# Patient Record
Sex: Male | Born: 1950 | Race: White | Hispanic: No | Marital: Married | Smoking: Never smoker
Health system: Southern US, Community
[De-identification: ages and names within clinical notes are randomized; demographics above are authoritative.]

## PROBLEM LIST (undated history)

## (undated) DIAGNOSIS — R946 Abnormal results of thyroid function studies: Secondary | ICD-10-CM

## (undated) DIAGNOSIS — J309 Allergic rhinitis, unspecified: Secondary | ICD-10-CM

## (undated) DIAGNOSIS — E785 Hyperlipidemia, unspecified: Secondary | ICD-10-CM

## (undated) DIAGNOSIS — F32A Depression, unspecified: Secondary | ICD-10-CM

## (undated) DIAGNOSIS — K219 Gastro-esophageal reflux disease without esophagitis: Secondary | ICD-10-CM

## (undated) DIAGNOSIS — C801 Malignant (primary) neoplasm, unspecified: Secondary | ICD-10-CM

## (undated) DIAGNOSIS — M1990 Unspecified osteoarthritis, unspecified site: Secondary | ICD-10-CM

## (undated) DIAGNOSIS — F329 Major depressive disorder, single episode, unspecified: Secondary | ICD-10-CM

## (undated) DIAGNOSIS — I10 Essential (primary) hypertension: Secondary | ICD-10-CM

## (undated) HISTORY — PX: HERNIA REPAIR: SHX51

---

## 2005-11-13 ENCOUNTER — Ambulatory Visit: Payer: Self-pay | Admitting: Unknown Physician Specialty

## 2007-01-28 ENCOUNTER — Other Ambulatory Visit: Payer: Self-pay

## 2007-01-28 ENCOUNTER — Emergency Department: Payer: Self-pay | Admitting: Internal Medicine

## 2009-01-17 ENCOUNTER — Ambulatory Visit: Payer: Self-pay | Admitting: Unknown Physician Specialty

## 2014-02-14 DIAGNOSIS — F329 Major depressive disorder, single episode, unspecified: Secondary | ICD-10-CM | POA: Insufficient documentation

## 2014-02-14 DIAGNOSIS — E291 Testicular hypofunction: Secondary | ICD-10-CM | POA: Insufficient documentation

## 2014-02-14 DIAGNOSIS — I10 Essential (primary) hypertension: Secondary | ICD-10-CM | POA: Insufficient documentation

## 2014-02-14 DIAGNOSIS — K219 Gastro-esophageal reflux disease without esophagitis: Secondary | ICD-10-CM | POA: Insufficient documentation

## 2014-02-14 DIAGNOSIS — F3289 Other specified depressive episodes: Secondary | ICD-10-CM | POA: Insufficient documentation

## 2014-02-14 DIAGNOSIS — E785 Hyperlipidemia, unspecified: Secondary | ICD-10-CM | POA: Insufficient documentation

## 2014-02-14 DIAGNOSIS — J309 Allergic rhinitis, unspecified: Secondary | ICD-10-CM | POA: Insufficient documentation

## 2016-12-02 ENCOUNTER — Encounter: Payer: Self-pay | Admitting: *Deleted

## 2016-12-03 ENCOUNTER — Encounter
Admission: RE | Disposition: A | Discharge status: Home / Self Care | Payer: Self-pay | Source: Ambulatory Visit | Attending: Unknown Physician Specialty

## 2016-12-03 ENCOUNTER — Ambulatory Visit: Payer: Medicare Other | Admitting: Anesthesiology

## 2016-12-03 ENCOUNTER — Ambulatory Visit
Admission: RE | Admit: 2016-12-03 | Discharge: 2016-12-03 | Disposition: A | Discharge status: Home / Self Care | Payer: Medicare Other | Source: Ambulatory Visit | Attending: Unknown Physician Specialty | Admitting: Unknown Physician Specialty

## 2016-12-03 ENCOUNTER — Encounter: Payer: Self-pay | Admitting: *Deleted

## 2016-12-03 DIAGNOSIS — J309 Allergic rhinitis, unspecified: Secondary | ICD-10-CM | POA: Diagnosis not present

## 2016-12-03 DIAGNOSIS — E785 Hyperlipidemia, unspecified: Secondary | ICD-10-CM | POA: Insufficient documentation

## 2016-12-03 DIAGNOSIS — Z1211 Encounter for screening for malignant neoplasm of colon: Secondary | ICD-10-CM | POA: Insufficient documentation

## 2016-12-03 DIAGNOSIS — Z86010 Personal history of colonic polyps: Secondary | ICD-10-CM | POA: Diagnosis not present

## 2016-12-03 DIAGNOSIS — I10 Essential (primary) hypertension: Secondary | ICD-10-CM | POA: Insufficient documentation

## 2016-12-03 DIAGNOSIS — E039 Hypothyroidism, unspecified: Secondary | ICD-10-CM | POA: Diagnosis not present

## 2016-12-03 DIAGNOSIS — K640 First degree hemorrhoids: Secondary | ICD-10-CM | POA: Diagnosis not present

## 2016-12-03 DIAGNOSIS — F329 Major depressive disorder, single episode, unspecified: Secondary | ICD-10-CM | POA: Diagnosis not present

## 2016-12-03 DIAGNOSIS — Z7982 Long term (current) use of aspirin: Secondary | ICD-10-CM | POA: Insufficient documentation

## 2016-12-03 DIAGNOSIS — K219 Gastro-esophageal reflux disease without esophagitis: Secondary | ICD-10-CM | POA: Diagnosis not present

## 2016-12-03 DIAGNOSIS — Z79899 Other long term (current) drug therapy: Secondary | ICD-10-CM | POA: Diagnosis not present

## 2016-12-03 DIAGNOSIS — Z9889 Other specified postprocedural states: Secondary | ICD-10-CM | POA: Diagnosis not present

## 2016-12-03 HISTORY — DX: Allergic rhinitis, unspecified: J30.9

## 2016-12-03 HISTORY — PX: COLONOSCOPY WITH PROPOFOL: SHX5780

## 2016-12-03 HISTORY — DX: Major depressive disorder, single episode, unspecified: F32.9

## 2016-12-03 HISTORY — DX: Essential (primary) hypertension: I10

## 2016-12-03 HISTORY — DX: Abnormal results of thyroid function studies: R94.6

## 2016-12-03 HISTORY — DX: Hyperlipidemia, unspecified: E78.5

## 2016-12-03 HISTORY — DX: Gastro-esophageal reflux disease without esophagitis: K21.9

## 2016-12-03 HISTORY — DX: Depression, unspecified: F32.A

## 2016-12-03 SURGERY — COLONOSCOPY WITH PROPOFOL
Anesthesia: General

## 2016-12-03 MED ORDER — FENTANYL CITRATE (PF) 100 MCG/2ML IJ SOLN
INTRAMUSCULAR | Status: DC | PRN
  Administered 2016-12-03 (×2): 25 ug via INTRAVENOUS
  Administered 2016-12-03: 50 ug via INTRAVENOUS

## 2016-12-03 MED ORDER — LIDOCAINE HCL (PF) 2 % IJ SOLN
INTRAMUSCULAR | Status: DC | PRN
  Administered 2016-12-03: 50 mg

## 2016-12-03 MED ORDER — PROPOFOL 10 MG/ML IV BOLUS
INTRAVENOUS | Status: DC | PRN
  Administered 2016-12-03: 30 mg via INTRAVENOUS
  Administered 2016-12-03: 20 mg via INTRAVENOUS

## 2016-12-03 MED ORDER — PROPOFOL 500 MG/50ML IV EMUL
INTRAVENOUS | Status: AC
  Filled 2016-12-03: qty 50

## 2016-12-03 MED ORDER — SODIUM CHLORIDE 0.9 % IV SOLN
INTRAVENOUS | Status: DC
  Administered 2016-12-03: 08:00:00 via INTRAVENOUS

## 2016-12-03 MED ORDER — FENTANYL CITRATE (PF) 100 MCG/2ML IJ SOLN
INTRAMUSCULAR | Status: AC
  Filled 2016-12-03: qty 2

## 2016-12-03 MED ORDER — PROPOFOL 500 MG/50ML IV EMUL
INTRAVENOUS | Status: DC | PRN
  Administered 2016-12-03: 75 ug/kg/min via INTRAVENOUS

## 2016-12-03 MED ORDER — SODIUM CHLORIDE 0.9 % IV SOLN: INTRAVENOUS | Status: DC

## 2016-12-03 NOTE — Transfer of Care (Signed)
Immediate Anesthesia Transfer of Care Note  Patient: Marc Caldwell  Procedure(s) Performed: Procedure(s): COLONOSCOPY WITH PROPOFOL (N/A)  Patient Location: PACU  Anesthesia Type:General  Level of Consciousness: sedated  Airway & Oxygen Therapy: Patient Spontanous Breathing and Patient connected to nasal cannula oxygen  Post-op Assessment: Report given to RN and Post -op Vital signs reviewed and stable  Post vital signs: Reviewed and stable  Last Vitals:  Vitals:   12/03/16 0743  BP: (!) 150/72  Pulse: 68  Resp: 18  Temp: 36.4 C    Last Pain:  Vitals:   12/03/16 0743  TempSrc: Tympanic         Complications: No apparent anesthesia complications

## 2016-12-03 NOTE — Op Note (Signed)
[facility] Patient Name: Marc Caldwell Procedure Date: 12/03/2016 8:22 AM MRN: [id number] Account #: [id number] Date of Birth: Apr 09, 1951 Admit Type: Outpatient Age: 66 Room: [facility] ENDO ROOM 1 Gender: Male Note Status: Finalized Procedure:            Colonoscopy Indications:          High risk colon cancer surveillance: Personal history                        of colonic polyps Providers:            Manya Silvas, MD Referring MD:         Caprice Renshaw MD (Referring MD) Medicines:            Propofol per Anesthesia Complications:        No immediate complications. Procedure:            Pre-Anesthesia Assessment:                       - After reviewing the risks and benefits, the patient                        was deemed in satisfactory condition to undergo the                        procedure.                       After obtaining informed consent, the colonoscope was                        passed under direct vision. Throughout the procedure,                        the patient's blood pressure, pulse, and oxygen                        saturations were monitored continuously. The                        Colonoscope was introduced through the anus and                        advanced to the the cecum, identified by appendiceal                        orifice and ileocecal valve. The colonoscopy was                        performed without difficulty. The patient tolerated the                        procedure well. The quality of the bowel preparation                        was good. Findings:      Internal hemorrhoids were found during endoscopy. The hemorrhoids were       small and Grade I (internal hemorrhoids that do not prolapse).      The exam was otherwise without abnormality.      THERE IS a SMALL soft nodule on  left side of prostate and you may need       to see a UROLOGIST. Impression:           - Internal hemorrhoids.            - The examination was otherwise normal.                       - No specimens collected. Recommendation:       - The findings and recommendations were discussed with                        the patient's family. Manya Silvas, MD 12/03/2016 8:49:19 AM This report has been signed electronically. Number of Addenda: 0 Note Initiated On: 12/03/2016 8:22 AM Scope Withdrawal Time: 0 hours 11 minutes 39 seconds  Total Procedure Duration: 0 hours 19 minutes 5 seconds       [facility]

## 2016-12-03 NOTE — Anesthesia Postprocedure Evaluation (Signed)
Anesthesia Post Note  Patient: Marc Caldwell  Procedure(s) Performed: Procedure(s) (LRB): COLONOSCOPY WITH PROPOFOL (N/A)  Patient location during evaluation: Endoscopy Anesthesia Type: General Level of consciousness: awake and alert and oriented Pain management: pain level controlled Vital Signs Assessment: post-procedure vital signs reviewed and stable Respiratory status: spontaneous breathing, nonlabored ventilation and respiratory function stable Cardiovascular status: blood pressure returned to baseline and stable Postop Assessment: no signs of nausea or vomiting Anesthetic complications: no     Last Vitals:  Vitals:   12/03/16 0900 12/03/16 0910  BP: 130/66 138/65  Pulse: 66 60  Resp: 17 13  Temp:      Last Pain:  Vitals:   12/03/16 0840  TempSrc: Tympanic                 [name]

## 2016-12-03 NOTE — H&P (Signed)
   Primary Care Physician:  Marcello Fennel, MD Primary Gastroenterologist:  Dr. Vira Agar  Pre-Procedure History & Physical: HPI:  Marc Caldwell is a 66 y.o. male is here for an colonoscopy.   Past Medical History:  Diagnosis Date  . Allergic rhinitis   . Borderline hypothyroidism   . Depressive disorder   . GERD (gastroesophageal reflux disease)   . Hyperlipidemia   . Hypertension     Past Surgical History:  Procedure Laterality Date  . HERNIA REPAIR      Prior to Admission medications   Medication Sig Start Date End Date Taking? Authorizing Provider  amLODipine (NORVASC) 5 MG tablet Take 5 mg by mouth daily.   Yes Historical Provider, MD  aspirin EC 81 MG tablet Take 81 mg by mouth daily.   Yes Historical Provider, MD  cetirizine (ZYRTEC) 10 MG tablet Take 10 mg by mouth daily.   Yes Historical Provider, MD  esomeprazole (NEXIUM) 40 MG packet Take 40 mg by mouth daily before breakfast.   Yes Historical Provider, MD  losartan-hydrochlorothiazide (HYZAAR) 100-25 MG tablet Take 1 tablet by mouth daily.   Yes Historical Provider, MD  Multiple Vitamin (MULTIVITAMIN) tablet Take 1 tablet by mouth daily.   Yes Historical Provider, MD  naftifine (NAFTIN) 1 % cream Apply topically daily.   Yes Historical Provider, MD  niacin 500 MG CR capsule Take 500 mg by mouth at bedtime.   Yes Historical Provider, MD  PARoxetine (PAXIL) 20 MG tablet Take 20 mg by mouth daily.   Yes Historical Provider, MD  simvastatin (ZOCOR) 20 MG tablet Take 20 mg by mouth daily.   Yes Historical Provider, MD  tamsulosin (FLOMAX) 0.4 MG CAPS capsule Take 0.4 mg by mouth.   Yes Historical Provider, MD    Allergies as of 11/03/2016  . (Not on File)    History reviewed. No pertinent family history.  Social History   Social History  . Marital status: Married    Spouse name: N/A  . Number of children: N/A  . Years of education: N/A   Occupational History  . Not on file.   Social History Main Topics   . Smoking status: Never Smoker  . Smokeless tobacco: Never Used  . Alcohol use Yes  . Drug use: No  . Sexual activity: Not on file   Other Topics Concern  . Not on file   Social History Narrative  . No narrative on file    Review of Systems: See HPI, otherwise negative ROS  Physical Exam: BP (!) 150/72   Pulse 68   Temp 97.5 F (36.4 C) (Tympanic)   Resp 18   Ht 5\' 11"  (1.803 m)   Wt 108 kg (238 lb)   SpO2 98%   BMI 33.19 kg/m  General:   Alert,  pleasant and cooperative in NAD Head:  Normocephalic and atraumatic. Neck:  Supple; no masses or thyromegaly. Lungs:  Clear throughout to auscultation.    Heart:  Regular rate and rhythm. Abdomen:  Soft, nontender and nondistended. Normal bowel sounds, without guarding, and without rebound.   Neurologic:  Alert and  oriented x4;  grossly normal neurologically.  Impression/Plan: Marc Caldwell is here for an colonoscopy to be performed for [facility] colon polyps  Risks, benefits, limitations, and alternatives regarding  colonoscopy have been reviewed with the patient.  Questions have been answered.  All parties agreeable.   Gaylyn Cheers, MD  12/03/2016, 8:22 AM

## 2016-12-03 NOTE — Anesthesia Post-op Follow-up Note (Cosign Needed)
Anesthesia QCDR form completed.        

## 2016-12-03 NOTE — Anesthesia Preprocedure Evaluation (Signed)
Anesthesia Evaluation  Patient identified by MRN, date of birth, ID band Patient awake    Reviewed: Allergy & Precautions, NPO status , Patient's Chart, lab work & pertinent test results  History of Anesthesia Complications Negative for: history of anesthetic complications  Airway Mallampati: III  TM Distance: >3 FB Neck ROM: Full    Dental no notable dental hx.    Pulmonary neg pulmonary ROS, Sleep apnea: suggestive hx, not diagnosed. , neg COPD,    breath sounds clear to auscultation- rhonchi (-) wheezing      Cardiovascular Exercise Tolerance: Good hypertension, Pt. on medications (-) CAD and (-) Past MI  Rhythm:Regular Rate:Normal - Systolic murmurs and - Diastolic murmurs    Neuro/Psych PSYCHIATRIC DISORDERS Depression negative neurological ROS     GI/Hepatic Neg liver ROS, GERD  ,  Endo/Other  neg diabetes  Renal/GU negative Renal ROS     Musculoskeletal negative musculoskeletal ROS (+)   Abdominal (+) + obese,   Peds  Hematology negative hematology ROS (+)   Anesthesia Other Findings Past Medical History: No date: Allergic rhinitis No date: Borderline hypothyroidism No date: Depressive disorder No date: GERD (gastroesophageal reflux disease) No date: Hyperlipidemia No date: Hypertension   Reproductive/Obstetrics                             Anesthesia Physical Anesthesia Plan  ASA: II  Anesthesia Plan: General   Post-op Pain Management:    Induction: Intravenous  Airway Management Planned: Natural Airway  Additional Equipment:   Intra-op Plan:   Post-operative Plan:   Informed Consent: I have reviewed the patients History and Physical, chart, labs and discussed the procedure including the risks, benefits and alternatives for the proposed anesthesia with the patient or authorized representative who has indicated his/her understanding and acceptance.   Dental  advisory given  Plan Discussed with: CRNA and Anesthesiologist  Anesthesia Plan Comments:         Anesthesia Quick Evaluation

## 2016-12-06 ENCOUNTER — Encounter: Payer: Self-pay | Admitting: Unknown Physician Specialty

## 2018-02-15 ENCOUNTER — Ambulatory Visit (INDEPENDENT_AMBULATORY_CARE_PROVIDER_SITE_OTHER): Payer: Medicare Other | Admitting: Urology

## 2018-02-15 ENCOUNTER — Encounter: Payer: Self-pay | Admitting: Urology

## 2018-02-15 VITALS — BP 168/84 | HR 79 | Resp 16 | Ht 71.0 in | Wt 238.6 lb

## 2018-02-15 DIAGNOSIS — N402 Nodular prostate without lower urinary tract symptoms: Secondary | ICD-10-CM | POA: Diagnosis not present

## 2018-02-15 DIAGNOSIS — N400 Enlarged prostate without lower urinary tract symptoms: Secondary | ICD-10-CM | POA: Insufficient documentation

## 2018-02-15 LAB — URINALYSIS, COMPLETE
BILIRUBIN UA: NEGATIVE
GLUCOSE, UA: NEGATIVE
KETONES UA: NEGATIVE
Leukocytes, UA: NEGATIVE
Nitrite, UA: NEGATIVE
Protein, UA: NEGATIVE
RBC UA: NEGATIVE
SPEC GRAV UA: 1.015 (ref 1.005–1.030)
Urobilinogen, Ur: 0.2 mg/dL (ref 0.2–1.0)
pH, UA: 6 (ref 5.0–7.5)

## 2018-02-15 NOTE — Progress Notes (Signed)
02/15/2018 9:19 AM   [name] September 28, 1951 [id number]  Referring provider: Derinda Late, MD [phone] [facility] [place], [place] [address]  Chief Complaint  Patient presents with  . Other    HPI: Marc Caldwell is a 67 year old male who presents for a second opinion regarding an abnormal digital rectal exam.  He states he was found to have a prostate nodule on a colonoscopy 2 to 3 years ago and saw Dr. Yves Dill.  He apparently had a prostate biopsy which was benign.  At his last colonoscopy in March 2018 he was found to have a soft nodule and saw Dr. Yves Dill back and was told it was most likely a cyst.  He wanted to come here for a second opinion.  His PSA has been normal and last PSA in January 2019 was 1.29.  Dr. Letta Kocher records have been requested though not received at the time of this visit.  He has no bothersome lower urinary tract symptoms.  Denies dysuria or gross hematuria.  Denies flank, abdominal, pelvic or scrotal pain.   PMH: Past Medical History:  Diagnosis Date  . Allergic rhinitis   . Borderline hypothyroidism   . Depressive disorder   . GERD (gastroesophageal reflux disease)   . Hyperlipidemia   . Hypertension     Surgical History: Past Surgical History:  Procedure Laterality Date  . COLONOSCOPY WITH PROPOFOL N/A 12/03/2016   Procedure: COLONOSCOPY WITH PROPOFOL;  Surgeon: Manya Silvas, MD;  Location: [facility] ENDOSCOPY;  Service: Endoscopy;  Laterality: N/A;  . HERNIA REPAIR      Home Medications:  Allergies as of 02/15/2018      Reactions   Niacin And Related    Penicillin G       Medication List        Accurate as of 02/15/18  9:19 AM. Always use your most recent med list.          amLODipine 5 MG tablet Commonly known as:  NORVASC Take 5 mg by mouth daily.   aspirin EC 81 MG tablet Take 81 mg by mouth daily.   cetirizine 10 MG tablet Commonly known as:  ZYRTEC Take 10 mg by mouth  daily.   esomeprazole 40 MG packet Commonly known as:  NEXIUM Take 40 mg by mouth daily before breakfast.   losartan-hydrochlorothiazide 100-25 MG tablet Commonly known as:  HYZAAR Take 1 tablet by mouth daily.   multivitamin tablet Take 1 tablet by mouth daily.   naftifine 1 % cream Commonly known as:  NAFTIN Apply topically daily.   niacin 500 MG CR capsule Take 500 mg by mouth at bedtime.   PARoxetine 20 MG tablet Commonly known as:  PAXIL Take 20 mg by mouth daily.   simvastatin 20 MG tablet Commonly known as:  ZOCOR Take 20 mg by mouth daily.   tamsulosin 0.4 MG Caps capsule Commonly known as:  FLOMAX Take 0.4 mg by mouth.       Allergies:  Allergies  Allergen Reactions  . Niacin And Related   . Penicillin G     Family History: Family History  Problem Relation Age of Onset  . Prostate cancer Neg Hx   . Kidney cancer Neg Hx   . Bladder Cancer Neg Hx     Social History:  reports that he has never smoked. He has never used smokeless tobacco. He reports that he drinks alcohol. He reports that he does not use drugs.  ROS: UROLOGY Frequent Urination?: No Hard to postpone urination?: No Burning/pain with urination?: No Get up at night to urinate?: No Leakage of urine?: No Urine stream starts and stops?: No Trouble starting stream?: No Do you have to strain to urinate?: No Blood in urine?: No Urinary tract infection?: No Sexually transmitted disease?: No Injury to kidneys or bladder?: No Painful intercourse?: No Weak stream?: No Erection problems?: Yes Penile pain?: No  Gastrointestinal Nausea?: No Vomiting?: No Indigestion/heartburn?: Yes Diarrhea?: No Constipation?: No  Constitutional Fever: No Night sweats?: No Weight loss?: No Fatigue?: No  Skin Skin rash/lesions?: No Itching?: No  Eyes Blurred vision?: No Double vision?: No  Ears/Nose/Throat Sore throat?: No Sinus problems?: No  Hematologic/Lymphatic Swollen glands?:  No Easy bruising?: No  Cardiovascular Leg swelling?: No Chest pain?: No  Respiratory Cough?: No Shortness of breath?: No  Endocrine Excessive thirst?: No  Musculoskeletal Back pain?: No Joint pain?: Yes  Neurological Headaches?: No Dizziness?: No  Psychologic Depression?: Yes Anxiety?: No  Physical Exam: BP (!) 168/84   Pulse 79   Resp 16   Ht 5\' 11"  (1.803 m)   Wt 238 lb 9.6 oz (108.2 kg)   SpO2 96%   BMI 33.28 kg/m   Constitutional:  Alert and oriented, No acute distress. HEENT: [place] AT, moist mucus membranes.  Trachea midline, no masses. Cardiovascular: No clubbing, cyanosis, or edema. Respiratory: Normal respiratory effort, no increased work of breathing. GI: Abdomen is soft, nontender, nondistended, no abdominal masses GU: No CVA tenderness.  Prostate 40 g with a soft, cystic area in the mid prostate just to the left of midline. Lymph: No cervical or inguinal lymphadenopathy. Skin: No rashes, bruises or suspicious lesions. Neurologic: Grossly intact, no focal deficits, moving all 4 extremities. Psychiatric: Normal mood and affect.   Assessment & Plan:   67 year old male with a nonsuspicious prostate nodule.  He has had a previous negative biopsy and PSA has been stable.  Will review prior urology records once received.  He requested to transfer care here and will see him back in 1 year.  Return in about 1 year (around 02/16/2019) for Recheck.   Abbie Sons, [place] [address] [place], [place] [address] [phone]

## 2018-05-22 ENCOUNTER — Telehealth: Payer: Self-pay | Admitting: Urology

## 2018-05-22 MED ORDER — TAMSULOSIN HCL 0.4 MG PO CAPS: 0.4000 mg | ORAL_CAPSULE | Freq: Every day | ORAL | 3 refills | Status: DC

## 2018-05-22 NOTE — Telephone Encounter (Signed)
Pt called office stating his pharmacy has reached out to this office twice asking for refills with no response. Now pt calling office asking for a refill of his .4mg  flomax - [facility], 90 day supply. Pt only has 2 pills left. Please advise. Thank you.

## 2018-05-22 NOTE — Telephone Encounter (Signed)
Refill sent.

## 2018-05-22 NOTE — Addendum Note (Signed)
Addended by: Tommy Rainwater on: 05/22/2018 03:47 PM   Modules accepted: Orders

## 2019-01-09 ENCOUNTER — Other Ambulatory Visit: Payer: Self-pay | Admitting: Orthopedic Surgery

## 2019-01-09 DIAGNOSIS — S46002A Unspecified injury of muscle(s) and tendon(s) of the rotator cuff of left shoulder, initial encounter: Secondary | ICD-10-CM

## 2019-01-09 DIAGNOSIS — G8929 Other chronic pain: Secondary | ICD-10-CM

## 2019-01-09 DIAGNOSIS — M25512 Pain in left shoulder: Secondary | ICD-10-CM

## 2019-01-09 DIAGNOSIS — M25312 Other instability, left shoulder: Secondary | ICD-10-CM

## 2019-01-18 ENCOUNTER — Other Ambulatory Visit: Payer: Self-pay | Admitting: Urology

## 2019-02-12 ENCOUNTER — Ambulatory Visit: Admission: RE | Admit: 2019-02-12 | Payer: Medicare Other | Source: Ambulatory Visit

## 2019-02-14 ENCOUNTER — Ambulatory Visit: Payer: Medicare Other | Admitting: Urology

## 2019-02-16 ENCOUNTER — Ambulatory Visit
Admission: RE | Admit: 2019-02-16 | Discharge: 2019-02-16 | Disposition: A | Discharge status: Home / Self Care | Payer: Medicare Other | Source: Ambulatory Visit | Attending: Orthopedic Surgery | Admitting: Orthopedic Surgery

## 2019-02-16 ENCOUNTER — Other Ambulatory Visit: Payer: Self-pay

## 2019-02-16 DIAGNOSIS — M25512 Pain in left shoulder: Secondary | ICD-10-CM | POA: Diagnosis present

## 2019-02-16 DIAGNOSIS — S46002A Unspecified injury of muscle(s) and tendon(s) of the rotator cuff of left shoulder, initial encounter: Secondary | ICD-10-CM | POA: Insufficient documentation

## 2019-02-16 DIAGNOSIS — M25312 Other instability, left shoulder: Secondary | ICD-10-CM | POA: Insufficient documentation

## 2019-02-16 DIAGNOSIS — G8929 Other chronic pain: Secondary | ICD-10-CM | POA: Insufficient documentation

## 2019-02-19 ENCOUNTER — Ambulatory Visit: Payer: Medicare Other

## 2019-03-02 ENCOUNTER — Inpatient Hospital Stay: Admission: RE | Admit: 2019-03-02 | Payer: Medicare Other | Source: Ambulatory Visit

## 2019-03-05 ENCOUNTER — Encounter
Admission: RE | Admit: 2019-03-05 | Discharge: 2019-03-05 | Disposition: A | Discharge status: Home / Self Care | Payer: Medicare Other | Source: Ambulatory Visit | Attending: Orthopedic Surgery | Admitting: Orthopedic Surgery

## 2019-03-05 ENCOUNTER — Other Ambulatory Visit: Payer: Self-pay

## 2019-03-05 ENCOUNTER — Ambulatory Visit: Payer: Medicare Other

## 2019-03-05 ENCOUNTER — Other Ambulatory Visit
Admission: RE | Admit: 2019-03-05 | Discharge: 2019-03-05 | Disposition: A | Discharge status: Home / Self Care | Payer: Medicare Other | Source: Ambulatory Visit | Attending: Orthopedic Surgery | Admitting: Orthopedic Surgery

## 2019-03-05 DIAGNOSIS — Z1159 Encounter for screening for other viral diseases: Secondary | ICD-10-CM | POA: Insufficient documentation

## 2019-03-05 DIAGNOSIS — I10 Essential (primary) hypertension: Secondary | ICD-10-CM | POA: Insufficient documentation

## 2019-03-05 HISTORY — DX: Unspecified osteoarthritis, unspecified site: M19.90

## 2019-03-05 HISTORY — DX: Malignant (primary) neoplasm, unspecified: C80.1

## 2019-03-05 NOTE — Patient Instructions (Signed)
Your procedure is scheduled on: 03-08-19 THURSDAY Report to Same Day Surgery 2nd floor medical mall [facility] Entrance-take elevator on left to 2nd floor.  Check in with surgery information desk.) To find out your arrival time please call [phone] between 1PM - 3PM on 03-07-19 [facility]  Remember: Instructions that are not followed completely may result in serious medical risk, up to and including death, or upon the discretion of your surgeon and anesthesiologist your surgery may need to be rescheduled.    _x___ 1. Do not eat food after midnight the night before your procedure. NO GUM OR CANDY AFTER MIDNIGHT. You may drink clear liquids up to 2 hours before you are scheduled to arrive at the hospital for your procedure.  Do not drink clear liquids within 2 hours of your scheduled arrival to the hospital.  Clear liquids include  --Water or Apple juice without pulp  --Clear carbohydrate beverage such as ClearFast or Gatorade  --Black Coffee or Clear Tea (No milk, no creamers, do not add anything to the coffee or Tea   ____Ensure clear carbohydrate drink on the way to the hospital for bariatric patients  ____Ensure clear carbohydrate drink 3 hours before surgery for Dr Dwyane Luo patients if physician instructed.     __x__ 2. No Alcohol for 24 hours before or after surgery.   __x__3. No Smoking or e-cigarettes for 24 prior to surgery.  Do not use any chewable tobacco products for at least 6 hour prior to surgery   ____  4. Bring all medications with you on the day of surgery if instructed.    __x__ 5. Notify your doctor if there is any change in your medical condition     (cold, fever, infections).    x___6. On the morning of surgery brush your teeth with toothpaste and water.  You may rinse your mouth with mouth wash if you wish.  Do not swallow any toothpaste or mouthwash.   Do not wear jewelry, make-up, hairpins, clips or nail polish.  Do not wear lotions, powders, or perfumes.  You may wear deodorant.  Do not shave 48 hours prior to surgery. Men may shave face and neck.  Do not bring valuables to the hospital.    [facility] is not responsible for any belongings or valuables.               Contacts, dentures or bridgework may not be worn into surgery.  Leave your suitcase in the car. After surgery it may be brought to your room.  For patients admitted to the hospital, discharge time is determined by your treatment team.  _  Patients discharged the day of surgery will not be allowed to drive home.  You will need someone to drive you home and stay with you the night of your procedure.    Please read over the following fact sheets that you were given:   [facility] Preparing for Surgery  _x___ TAKE THE FOLLOWING MEDICATION THE MORNING OF SURGERY WITH A SMALL SIP OF WATER. These include:  1. ZYRTEC (CETIRIZINE)  2.PAXIL (PAROXETINE)  3. NEXIUM (ESOMEPRAZOLE)  4. TAKE AN EXTRA NEXIUM THE NIGHT BEFORE YOUR SURGERY  5.  6.  ____Fleets enema or Magnesium Citrate as directed.   _x___ Use CHG Soap or sage wipes as directed on instruction sheet   ____ Use inhalers on the day of surgery and bring to hospital day of surgery  ____ Stop Metformin and Janumet 2 days prior to surgery.  ____ Take 1/2 of usual insulin dose the night before surgery and none on the morning surgery.   _x___ Follow recommendations from Cardiologist, Pulmonologist or PCP regarding stopping Aspirin, Coumadin, Plavix ,Eliquis, Effient, or Pradaxa, and Pletal-STOP ASPIRIN NOW  X____Stop Anti-inflammatories such as Advil, Aleve, Ibuprofen, Motrin, Naproxen, Naprosyn, Goodies powders or aspirin products NOW-OK to take Tylenol    _x___ Stop supplements until after surgery-STOP FISH OIL, GARLIC AND GLUCOSAMINE NOW-MAY RESUME AFTER SURGERY   ____ Bring C-Pap to the hospital.

## 2019-03-06 LAB — NOVEL CORONAVIRUS, NAA (HOSP ORDER, SEND-OUT TO REF LAB; TAT 18-24 HRS): SARS-CoV-2, NAA: NOT DETECTED

## 2019-03-07 MED ORDER — CLINDAMYCIN PHOSPHATE 900 MG/50ML IV SOLN
900.0000 mg | Freq: Once | INTRAVENOUS | Status: AC
  Administered 2019-03-08: 14:00:00 900 mg via INTRAVENOUS

## 2019-03-08 ENCOUNTER — Ambulatory Visit: Payer: Medicare Other

## 2019-03-08 ENCOUNTER — Other Ambulatory Visit: Payer: Self-pay

## 2019-03-08 ENCOUNTER — Encounter
Admission: RE | Disposition: A | Discharge status: Home / Self Care | Payer: Self-pay | Source: Home / Self Care | Attending: Orthopedic Surgery

## 2019-03-08 ENCOUNTER — Ambulatory Visit: Payer: Medicare Other | Admitting: Anesthesiology

## 2019-03-08 ENCOUNTER — Ambulatory Visit
Admission: RE | Admit: 2019-03-08 | Discharge: 2019-03-08 | Disposition: A | Discharge status: Home / Self Care | Payer: Medicare Other | Attending: Orthopedic Surgery | Admitting: Orthopedic Surgery

## 2019-03-08 DIAGNOSIS — W1839XA Other fall on same level, initial encounter: Secondary | ICD-10-CM | POA: Insufficient documentation

## 2019-03-08 DIAGNOSIS — Z888 Allergy status to other drugs, medicaments and biological substances status: Secondary | ICD-10-CM | POA: Insufficient documentation

## 2019-03-08 DIAGNOSIS — N400 Enlarged prostate without lower urinary tract symptoms: Secondary | ICD-10-CM | POA: Insufficient documentation

## 2019-03-08 DIAGNOSIS — M19012 Primary osteoarthritis, left shoulder: Secondary | ICD-10-CM | POA: Insufficient documentation

## 2019-03-08 DIAGNOSIS — I10 Essential (primary) hypertension: Secondary | ICD-10-CM | POA: Diagnosis not present

## 2019-03-08 DIAGNOSIS — K219 Gastro-esophageal reflux disease without esophagitis: Secondary | ICD-10-CM | POA: Insufficient documentation

## 2019-03-08 DIAGNOSIS — Z7982 Long term (current) use of aspirin: Secondary | ICD-10-CM | POA: Diagnosis not present

## 2019-03-08 DIAGNOSIS — M7542 Impingement syndrome of left shoulder: Secondary | ICD-10-CM | POA: Diagnosis not present

## 2019-03-08 DIAGNOSIS — E785 Hyperlipidemia, unspecified: Secondary | ICD-10-CM | POA: Diagnosis not present

## 2019-03-08 DIAGNOSIS — S46012A Strain of muscle(s) and tendon(s) of the rotator cuff of left shoulder, initial encounter: Secondary | ICD-10-CM | POA: Diagnosis not present

## 2019-03-08 DIAGNOSIS — Z8249 Family history of ischemic heart disease and other diseases of the circulatory system: Secondary | ICD-10-CM | POA: Diagnosis not present

## 2019-03-08 DIAGNOSIS — M7582 Other shoulder lesions, left shoulder: Secondary | ICD-10-CM | POA: Insufficient documentation

## 2019-03-08 DIAGNOSIS — M795 Residual foreign body in soft tissue: Secondary | ICD-10-CM

## 2019-03-08 DIAGNOSIS — Z880 Allergy status to penicillin: Secondary | ICD-10-CM | POA: Insufficient documentation

## 2019-03-08 DIAGNOSIS — F329 Major depressive disorder, single episode, unspecified: Secondary | ICD-10-CM | POA: Diagnosis not present

## 2019-03-08 DIAGNOSIS — Z79899 Other long term (current) drug therapy: Secondary | ICD-10-CM | POA: Diagnosis not present

## 2019-03-08 DIAGNOSIS — M75102 Unspecified rotator cuff tear or rupture of left shoulder, not specified as traumatic: Secondary | ICD-10-CM | POA: Diagnosis present

## 2019-03-08 HISTORY — PX: SHOULDER ARTHROSCOPY WITH OPEN ROTATOR CUFF REPAIR: SHX6092

## 2019-03-08 SURGERY — ARTHROSCOPY, SHOULDER WITH REPAIR, ROTATOR CUFF, OPEN
Anesthesia: Regional | Laterality: Left

## 2019-03-08 MED ORDER — LACTATED RINGERS IV SOLN
INTRAVENOUS | Status: DC | PRN
  Administered 2019-03-08: 32 mL

## 2019-03-08 MED ORDER — KETAMINE HCL 50 MG/ML IJ SOLN
INTRAMUSCULAR | Status: AC
  Filled 2019-03-08: qty 10

## 2019-03-08 MED ORDER — FENTANYL CITRATE (PF) 100 MCG/2ML IJ SOLN
INTRAMUSCULAR | Status: AC
  Filled 2019-03-08: qty 2

## 2019-03-08 MED ORDER — SODIUM CHLORIDE 0.9 % IV SOLN
INTRAVENOUS | Status: DC | PRN
  Administered 2019-03-08: 14:00:00 30 ug/min via INTRAVENOUS

## 2019-03-08 MED ORDER — ONDANSETRON HCL 4 MG/2ML IJ SOLN
INTRAMUSCULAR | Status: DC | PRN
  Administered 2019-03-08: 4 mg via INTRAVENOUS

## 2019-03-08 MED ORDER — ACETAMINOPHEN 500 MG PO TABS: 1000.0000 mg | ORAL_TABLET | Freq: Three times a day (TID) | ORAL | 2 refills | Status: AC

## 2019-03-08 MED ORDER — ONDANSETRON 4 MG PO TBDP: 4.0000 mg | ORAL_TABLET | Freq: Three times a day (TID) | ORAL | 0 refills | Status: DC | PRN

## 2019-03-08 MED ORDER — PROPOFOL 10 MG/ML IV BOLUS
INTRAVENOUS | Status: AC
  Filled 2019-03-08: qty 40

## 2019-03-08 MED ORDER — PHENYLEPHRINE HCL (PRESSORS) 10 MG/ML IV SOLN
INTRAVENOUS | Status: DC | PRN
  Administered 2019-03-08 (×2): 100 ug via INTRAVENOUS

## 2019-03-08 MED ORDER — ONDANSETRON 4 MG PO TBDP
ORAL_TABLET | ORAL | Status: AC
  Filled 2019-03-08: qty 1

## 2019-03-08 MED ORDER — BUPIVACAINE HCL (PF) 0.5 % IJ SOLN
INTRAMUSCULAR | Status: AC
  Filled 2019-03-08: qty 10

## 2019-03-08 MED ORDER — ACETAMINOPHEN 10 MG/ML IV SOLN
INTRAVENOUS | Status: AC
  Filled 2019-03-08: qty 100

## 2019-03-08 MED ORDER — LACTATED RINGERS IV SOLN: INTRAVENOUS | Status: DC

## 2019-03-08 MED ORDER — FENTANYL CITRATE (PF) 100 MCG/2ML IJ SOLN
50.0000 ug | Freq: Once | INTRAMUSCULAR | Status: AC
  Administered 2019-03-08: 50 ug via INTRAVENOUS

## 2019-03-08 MED ORDER — LACTATED RINGERS IV SOLN
INTRAVENOUS | Status: DC | PRN
  Administered 2019-03-08 (×2): via INTRAVENOUS

## 2019-03-08 MED ORDER — LIDOCAINE HCL (PF) 1 % IJ SOLN
INTRAMUSCULAR | Status: AC
  Filled 2019-03-08: qty 5

## 2019-03-08 MED ORDER — ROCURONIUM BROMIDE 100 MG/10ML IV SOLN
INTRAVENOUS | Status: DC | PRN
  Administered 2019-03-08: 50 mg via INTRAVENOUS
  Administered 2019-03-08 (×3): 20 mg via INTRAVENOUS

## 2019-03-08 MED ORDER — SUGAMMADEX SODIUM 200 MG/2ML IV SOLN
INTRAVENOUS | Status: DC | PRN
  Administered 2019-03-08: 200 mg via INTRAVENOUS

## 2019-03-08 MED ORDER — LIDOCAINE HCL (PF) 2 % IJ SOLN
INTRAMUSCULAR | Status: AC
  Filled 2019-03-08: qty 10

## 2019-03-08 MED ORDER — ASPIRIN EC 325 MG PO TBEC: 325.0000 mg | DELAYED_RELEASE_TABLET | Freq: Every day | ORAL | 0 refills | Status: AC

## 2019-03-08 MED ORDER — LIDOCAINE-EPINEPHRINE (PF) 1 %-1:200000 IJ SOLN
INTRAMUSCULAR | Status: AC
  Filled 2019-03-08: qty 30

## 2019-03-08 MED ORDER — MIDAZOLAM HCL 2 MG/2ML IJ SOLN
1.0000 mg | Freq: Once | INTRAMUSCULAR | Status: AC
  Administered 2019-03-08: 1 mg via INTRAVENOUS

## 2019-03-08 MED ORDER — PROPOFOL 10 MG/ML IV BOLUS
INTRAVENOUS | Status: DC | PRN
  Administered 2019-03-08: 50 mg via INTRAVENOUS
  Administered 2019-03-08: 150 mg via INTRAVENOUS

## 2019-03-08 MED ORDER — FENTANYL CITRATE (PF) 250 MCG/5ML IJ SOLN
INTRAMUSCULAR | Status: AC
  Filled 2019-03-08: qty 5

## 2019-03-08 MED ORDER — LIDOCAINE HCL (CARDIAC) PF 100 MG/5ML IV SOSY
PREFILLED_SYRINGE | INTRAVENOUS | Status: DC | PRN
  Administered 2019-03-08: 80 mg via INTRAVENOUS

## 2019-03-08 MED ORDER — DEXAMETHASONE SODIUM PHOSPHATE 10 MG/ML IJ SOLN
INTRAMUSCULAR | Status: DC | PRN
  Administered 2019-03-08: 4 mg via INTRAVENOUS

## 2019-03-08 MED ORDER — EPHEDRINE SULFATE 50 MG/ML IJ SOLN
INTRAMUSCULAR | Status: DC | PRN
  Administered 2019-03-08 (×5): 10 mg via INTRAVENOUS

## 2019-03-08 MED ORDER — VASOPRESSIN 20 UNIT/ML IV SOLN
INTRAVENOUS | Status: DC | PRN
  Administered 2019-03-08 (×2): 1 [IU] via INTRAVENOUS
  Administered 2019-03-08: 2 [IU] via INTRAVENOUS
  Administered 2019-03-08 (×5): 1 [IU] via INTRAVENOUS
  Administered 2019-03-08 (×2): 2 [IU] via INTRAVENOUS
  Administered 2019-03-08 (×2): 1 [IU] via INTRAVENOUS

## 2019-03-08 MED ORDER — CLINDAMYCIN PHOSPHATE 900 MG/50ML IV SOLN
INTRAVENOUS | Status: AC
  Filled 2019-03-08: qty 50

## 2019-03-08 MED ORDER — OXYCODONE HCL 5 MG PO TABS
5.0000 mg | ORAL_TABLET | ORAL | Status: DC | PRN
  Administered 2019-03-08: 5 mg via ORAL

## 2019-03-08 MED ORDER — ONDANSETRON HCL 4 MG/2ML IJ SOLN
INTRAMUSCULAR | Status: AC
  Filled 2019-03-08: qty 2

## 2019-03-08 MED ORDER — ACETAMINOPHEN 10 MG/ML IV SOLN
INTRAVENOUS | Status: DC | PRN
  Administered 2019-03-08: 1000 mg via INTRAVENOUS

## 2019-03-08 MED ORDER — KETAMINE HCL 10 MG/ML IJ SOLN
INTRAMUSCULAR | Status: DC | PRN
  Administered 2019-03-08: 75 mg via INTRAVENOUS

## 2019-03-08 MED ORDER — ONDANSETRON 4 MG PO TBDP
4.0000 mg | ORAL_TABLET | Freq: Three times a day (TID) | ORAL | Status: DC | PRN
  Administered 2019-03-08: 4 mg via ORAL

## 2019-03-08 MED ORDER — HYDROMORPHONE HCL 1 MG/ML IJ SOLN: 0.2500 mg | INTRAMUSCULAR | Status: DC | PRN

## 2019-03-08 MED ORDER — FENTANYL CITRATE (PF) 100 MCG/2ML IJ SOLN
INTRAMUSCULAR | Status: AC
  Administered 2019-03-08: 50 ug via INTRAVENOUS
  Filled 2019-03-08: qty 2

## 2019-03-08 MED ORDER — BUPIVACAINE LIPOSOME 1.3 % IJ SUSP
INTRAMUSCULAR | Status: AC
  Filled 2019-03-08: qty 20

## 2019-03-08 MED ORDER — MIDAZOLAM HCL 2 MG/2ML IJ SOLN
INTRAMUSCULAR | Status: AC
  Administered 2019-03-08: 13:00:00 1 mg via INTRAVENOUS
  Filled 2019-03-08: qty 2

## 2019-03-08 MED ORDER — FENTANYL CITRATE (PF) 100 MCG/2ML IJ SOLN
INTRAMUSCULAR | Status: DC | PRN
  Administered 2019-03-08 (×5): 50 ug via INTRAVENOUS

## 2019-03-08 MED ORDER — EPINEPHRINE (ANAPHYLAXIS) 30 MG/30ML IJ SOLN
INTRAMUSCULAR | Status: AC
  Filled 2019-03-08: qty 30

## 2019-03-08 MED ORDER — SUGAMMADEX SODIUM 200 MG/2ML IV SOLN
INTRAVENOUS | Status: AC
  Filled 2019-03-08: qty 2

## 2019-03-08 MED ORDER — OXYCODONE HCL 5 MG PO TABS: 5.0000 mg | ORAL_TABLET | ORAL | 0 refills | Status: DC | PRN

## 2019-03-08 MED ORDER — OXYCODONE HCL 5 MG PO TABS
ORAL_TABLET | ORAL | Status: AC
  Filled 2019-03-08: qty 1

## 2019-03-08 SURGICAL SUPPLY — 100 items
ADAPTER IRRIG TUBE 2 SPIKE SOL (ADAPTER) ×6 IMPLANT
ALLOGRAFT MATRIX HD 5 5X8 (Tissue) ×2 IMPLANT
ALLOGRAFT MATRIX HD 5MM 5X8CM (Tissue) ×1 IMPLANT
ANCHOR SUT BIO SW 4.75X19.1 (Anchor) ×6 IMPLANT
ANCHOR SUT BIOC ST 3X145 (Anchor) IMPLANT
ANCHOR SUT FBRTK SUTURETAP 1.3 (Anchor) ×3 IMPLANT
ANCHOR SUTURETAK 3X12.7 BIOC (Anchor) ×12 IMPLANT
BIT DRILL RIGD1.8MM FBRTK STRL (DRILL) ×1 IMPLANT
BLADE OSCILLATING/SAGITTAL (BLADE)
BLADE SW THK.38XMED LNG THN (BLADE) IMPLANT
BUR BR 5.5 12 FLUTE (BURR) IMPLANT
BUR RADIUS 4.0X18.5 (BURR) IMPLANT
CANNULA 5.75X7CM (CANNULA) ×1
CANNULA PART THRD DISP 5.75X7 (CANNULA) ×2 IMPLANT
CANNULA PARTIAL THREAD 2X7 (CANNULA) ×3 IMPLANT
CANNULA TWIST IN 8.25X9CM (CANNULA) IMPLANT
CHLORAPREP W/TINT 26 (MISCELLANEOUS) ×3 IMPLANT
CLOSURE WOUND 1/2 X4 (GAUZE/BANDAGES/DRESSINGS)
COOLER POLAR GLACIER W/PUMP (MISCELLANEOUS) ×3 IMPLANT
COVER LIGHT HANDLE STERIS (MISCELLANEOUS) ×3 IMPLANT
COVER WAND RF STERILE (DRAPES) ×3 IMPLANT
CRADLE LAMINECT ARM (MISCELLANEOUS) ×6 IMPLANT
DERMABOND ADVANCED (GAUZE/BANDAGES/DRESSINGS)
DERMABOND ADVANCED .7 DNX12 (GAUZE/BANDAGES/DRESSINGS) IMPLANT
DEVICE SUCT BLK HOLE OR FLOOR (MISCELLANEOUS) ×6 IMPLANT
DRAPE C-ARM XRAY 36X54 (DRAPES) ×3 IMPLANT
DRAPE IMP U-DRAPE 54X76 (DRAPES) ×6 IMPLANT
DRAPE INCISE IOBAN 66X45 STRL (DRAPES) ×3 IMPLANT
DRAPE SHEET LG 3/4 BI-LAMINATE (DRAPES) ×3 IMPLANT
DRAPE STERI 35X30 U-POUCH (DRAPES) ×3 IMPLANT
DRAPE U-SHAPE 47X51 STRL (DRAPES) ×3 IMPLANT
DRILL RIGID 1.8MM FBRTK STRL (DRILL) ×3
DRSG TEGADERM 4X4.75 (GAUZE/BANDAGES/DRESSINGS) ×12 IMPLANT
ELECT REM PT RETURN 9FT ADLT (ELECTROSURGICAL) ×3
ELECTRODE REM PT RTRN 9FT ADLT (ELECTROSURGICAL) ×1 IMPLANT
GAUZE SPONGE 4X4 12PLY STRL (GAUZE/BANDAGES/DRESSINGS) ×3 IMPLANT
GAUZE XEROFORM 1X8 LF (GAUZE/BANDAGES/DRESSINGS) ×3 IMPLANT
GLOVE BIO SURGEON STRL SZ8 (GLOVE) ×3 IMPLANT
GLOVE BIOGEL PI IND STRL 8 (GLOVE) ×1 IMPLANT
GLOVE BIOGEL PI INDICATOR 8 (GLOVE) ×2
GLOVE SURG SYN 8.0 (GLOVE) ×3 IMPLANT
GOWN STRL REUS W/ TWL LRG LVL3 (GOWN DISPOSABLE) ×1 IMPLANT
GOWN STRL REUS W/TWL LRG LVL3 (GOWN DISPOSABLE) ×2
GOWN STRL REUS W/TWL LRG LVL4 (GOWN DISPOSABLE) ×3 IMPLANT
IMP SYSTEM BRIDGE 4.75X19.1 (Anchor) ×3 IMPLANT
IMPL SYSTEM BRIDGE 4.75X19.1 (Anchor) ×1 IMPLANT
IV LACTATED RINGER IRRG 3000ML (IV SOLUTION) ×64
IV LR IRRIG 3000ML ARTHROMATIC (IV SOLUTION) ×32 IMPLANT
KIT PERC INSERT 3.0 KNTLS (KITS) ×3 IMPLANT
KIT SPEAR STR 1.6MM DRILL (MISCELLANEOUS) ×6 IMPLANT
KIT STABILIZATION SHOULDER (MISCELLANEOUS) ×3 IMPLANT
KIT TURNOVER KIT A (KITS) ×3 IMPLANT
MANIFOLD NEPTUNE II (INSTRUMENTS) ×3 IMPLANT
MASK FACE SPIDER DISP (MASK) ×3 IMPLANT
MAT ABSORB  FLUID 56X50 GRAY (MISCELLANEOUS) ×4
MAT ABSORB FLUID 56X50 GRAY (MISCELLANEOUS) ×2 IMPLANT
NDL MAYO CATGUT SZ4 (NEEDLE) ×3 IMPLANT
NDL SAFETY ECLIPSE 18X1.5 (NEEDLE) ×1 IMPLANT
NEEDLE HYPO 18GX1.5 SHARP (NEEDLE) ×2
NEEDLE HYPO 22GX1.5 SAFETY (NEEDLE) ×3 IMPLANT
NEEDLE MAYO 6 CRC TAPER PT (NEEDLE) IMPLANT
NEEDLE SCORPION MULTI FIRE (NEEDLE) ×6 IMPLANT
PACK ARTHROSCOPY SHOULDER (MISCELLANEOUS) ×3 IMPLANT
PAD ABD DERMACEA PRESS 5X9 (GAUZE/BANDAGES/DRESSINGS) ×3 IMPLANT
PAD WRAPON POLAR SHDR XLG (MISCELLANEOUS) ×1 IMPLANT
PAD WRAPON POLOR MULTI XL (MISCELLANEOUS) ×1 IMPLANT
PASSER SUT 70D UP ANGLED (INSTRUMENTS) ×3 IMPLANT
SET TUBE SUCT SHAVER OUTFL 24K (TUBING) ×3 IMPLANT
SET TUBE TIP INTRA-ARTICULAR (MISCELLANEOUS) ×3 IMPLANT
SLEEVE PROTECTION STRL DISP (MISCELLANEOUS) ×3 IMPLANT
SLING ULTRA II M (MISCELLANEOUS) ×3 IMPLANT
STAPLER SKIN PROX 35W (STAPLE) IMPLANT
STRAP SAFETY 5IN WIDE (MISCELLANEOUS) ×3 IMPLANT
STRIP CLOSURE SKIN 1/2X4 (GAUZE/BANDAGES/DRESSINGS) IMPLANT
SUT ETHILON 3-0 (SUTURE) IMPLANT
SUT ETHILON 3-0 FS-10 30 BLK (SUTURE) ×6
SUT ETHILON 4-0 (SUTURE) ×2
SUT ETHILON 4-0 FS2 18XMFL BLK (SUTURE) ×1
SUT LASSO 90 DEG SD STR (SUTURE) IMPLANT
SUT MNCRL 4-0 (SUTURE)
SUT MNCRL 4-0 27XMFL (SUTURE)
SUT PROLENE 0 CT 2 (SUTURE) IMPLANT
SUT TICRON 2-0 30IN 311381 (SUTURE) IMPLANT
SUT TIGER TAPE 7 IN WHITE (SUTURE) ×3 IMPLANT
SUT VIC AB 0 CT1 36 (SUTURE) IMPLANT
SUT VIC AB 2-0 CT2 27 (SUTURE) IMPLANT
SUT VICRYL 3-0 27IN (SUTURE) IMPLANT
SUTURE EHLN 3-0 FS-10 30 BLK (SUTURE) ×2 IMPLANT
SUTURE ETHLN 4-0 FS2 18XMF BLK (SUTURE) ×1 IMPLANT
SUTURE MNCRL 4-0 27XMF (SUTURE) IMPLANT
SYR 10ML LL (SYRINGE) ×3 IMPLANT
TAPE CLOTH 3X10 WHT NS LF (GAUZE/BANDAGES/DRESSINGS) ×3 IMPLANT
TAPE MICROFOAM 4IN (TAPE) ×3 IMPLANT
TUBING ARTHRO INFLOW-ONLY STRL (TUBING) ×3 IMPLANT
TUBING CONNECTING 10 (TUBING) ×2 IMPLANT
TUBING CONNECTING 10' (TUBING) ×1
WAND WEREWOLF FLOW 90D (MISCELLANEOUS) ×3 IMPLANT
WRAP-ON POLOR PAD MULTI XL (MISCELLANEOUS) ×1
WRAPON POLAR PAD SHDR XLG (MISCELLANEOUS) ×3
WRAPON POLOR PAD MULTI XL (MISCELLANEOUS) ×2

## 2019-03-08 NOTE — Anesthesia Preprocedure Evaluation (Signed)
Anesthesia Evaluation  Patient identified by MRN, date of birth, ID band Patient awake    Reviewed: Allergy & Precautions, H&P , NPO status , Patient's Chart, lab work & pertinent test results  Airway Mallampati: III  TM Distance: <3 FB     Dental  (+) Chipped   Pulmonary neg pulmonary ROS, neg shortness of breath, neg COPD,           Cardiovascular hypertension, (-) angina(-) Cardiac Stents negative cardio ROS  (-) dysrhythmias      Neuro/Psych PSYCHIATRIC DISORDERS Depression negative neurological ROS     GI/Hepatic Neg liver ROS, GERD  Controlled,  Endo/Other  negative endocrine ROS  Renal/GU      Musculoskeletal   Abdominal   Peds  Hematology negative hematology ROS (+)   Anesthesia Other Findings Past Medical History: No date: Allergic rhinitis No date: Arthritis No date: Cancer ([place])     Comment:  basal cell skin ca No date: Depressive disorder No date: GERD (gastroesophageal reflux disease) No date: Hyperlipidemia No date: Hypertension  Past Surgical History: 12/03/2016: COLONOSCOPY WITH PROPOFOL; N/A     Comment:  Procedure: COLONOSCOPY WITH PROPOFOL;  Surgeon: Manya Silvas, MD;  Location: [facility] ENDOSCOPY;  Service:               Endoscopy;  Laterality: N/A; No date: HERNIA REPAIR     Reproductive/Obstetrics negative OB ROS                             Anesthesia Physical Anesthesia Plan  ASA: II  Anesthesia Plan: General ETT and Regional   Post-op Pain Management:    Induction:   PONV Risk Score and Plan: Ondansetron, Dexamethasone, Midazolam and Treatment may vary due to age or medical condition  Airway Management Planned:   Additional Equipment:   Intra-op Plan:   Post-operative Plan:   Informed Consent: I have reviewed the patients History and Physical, chart, labs and discussed the procedure including the risks, benefits and  alternatives for the proposed anesthesia with the patient or authorized representative who has indicated his/her understanding and acceptance.     Dental Advisory Given  Plan Discussed with: Anesthesiologist and CRNA  Anesthesia Plan Comments:         Anesthesia Quick Evaluation

## 2019-03-08 NOTE — Anesthesia Post-op Follow-up Note (Signed)
Anesthesia QCDR form completed.        

## 2019-03-08 NOTE — Op Note (Addendum)
SURGERY DATE: 03/08/2019    PRE-OP DIAGNOSIS:  1. Left rotator cuff tear (subscapularis, supraspinatus) 2. Left subacromial impingement 3. Left biceps tendinopathy 4. Left acromioclavicular joint arthritis   POST-OP DIAGNOSIS: 1. Left rotator cuff tear (subscapularis, supraspinatus) 2. Left subacromial impingement 3. Left biceps tendinopathy 4. Left acromioclavicular joint arthritis   PROCEDURES:  1. Left arthroscopic rotator cuff repair (subscapularis) 2. Left mini-open rotator cuff reconstruction using allograft patch (superior capsular reconstruction) 3. Left open biceps tenodesis 4. Left arthroscopic extensive debridement of shoulder (glenohumeral and subacromial spaces) 5. Left arthroscopic subacromial decompression 6. Left arthroscopic distal clavicle excision   SURGEON: Cato Mulligan, MD   ASSISTANT: Anitra Lauth, PA   ANESTHESIA: Gen with Exparil interscalene block    ESTIMATED BLOOD LOSS: 25cc   DRAINS:  none   TOTAL IV FLUIDS: per anesthesia      SPECIMENS: none   IMPLANTS:   - Arthrex 4.75m SwiveLock x 5 - Arthrex Knotless FiberTak x 3 - Arthrex FiberTak Suture Anchor - Double Loaded x 1 - RTI 532mMatrix HD allograft   OPERATIVE FINDINGS:  Examination under anesthesia: A careful examination under anesthesia was performed.  Passive range of motion was: FF: 150; ER at side: 40; ER in abduction: 90; IR in abduction: 50.  Anterior load shift: NT.  Posterior load shift: NT.  Sulcus in neutral: NT.  Sulcus in ER: NT.     Intra-operative findings: A thorough arthroscopic examination of the shoulder was performed.  The findings are: 1. Biceps tendon: tendinopathy with medial subluxation 2. Superior labrum: injected with surrounding synovitis 3. Posterior labrum and capsule: normal 4. Inferior capsule and inferior recess: normal 5. Glenoid cartilage surface: Grade 1 changes  6. Supraspinatus attachment: full-thickness tear with retraction to the glenoid 7.  Posterior rotator cuff attachment: Small tear of the anterior aspect of the infraspinatus remainder of the posterior rotator cuff is intact 8. Humeral head articular cartilage: Grade 1-2 changes 9. Rotator interval: significant synovitis 10: Subscapularis tendon: Partial-thickness articular sided tear of the superior fibers 11. Anterior labrum: degenerative 12. IGHL: significant synovitis around IGHL   OPERATIVE REPORT:    Indications for procedure: Marc Caldwell a [age].o. male with acute onset L shoulder pain after a fall ~3-4 months ago. He has had difficulty raising arm over his head since that time. Clinical exam and MRI were suggestive of rotator cuff tear including partial subscapularis, complete supraspinatus, and anterior infraspinatus tears; subacromial impingement; acromioclavicular joint arthritis; and biceps tendinopathy/tearing.  There was significant atrophy of the supraspinatus on MRI suggesting a more chronic tear.  Given the patient's continued symptoms, we decided to proceed with surgical management. After discussion of risks, benefits, and alternatives to surgery, the patient elected to proceed.    Procedure in detail:   [facility].  I marked the operative shoulder with my initials. I reviewed the risks and benefits of the proposed surgical intervention, and the patient (and/or patient's guardian) wished to proceed.  Anesthesia was then performed with an Exparil interscalene block.  The patient was transferred to the operative suite and placed in the beach chair position.     SCDs were placed on the lower extremities. Appropriate IV antibiotics were administered prior to incision. The operative upper extremity was then prepped and draped in standard fashion. A time out was performed confirming the correct extremity, correct patient, and correct procedure.    I then created a standard posterior portal with  an 11  blade. The glenohumeral joint was easily entered with a blunt trochar and the arthroscope introduced. The findings of diagnostic arthroscopy are described above.  A standard anterior portal was made.  I debrided degenerative tissue including the synovitic tissue about the rotator interval and IGHL as well as the anterior and superior labrum. I then coagulated the inflamed synovium in these regions to obtain hemostasis and reduce the risk of post-operative swelling using an Arthrocare radiofrequency device.   The subscapularis tear was identified.  A superior anterolateral portal was made under needle localization.  A 7 mm cannula was placed.  The, comma tissue indicating the superolateral border of the subscapularis was identified readily.  The tip of the coracoid as well as the conjoined tendon and coracoacromial ligaments were visualized after debriding rotator interval tissue.  Tissue about the subscapularis was released anteriorly, superiorly, and posteriorly to allow for improved mobilization.  The lesser tuberosity footprint was prepared with a combination of electrocautery and an arthroscopic curette.  A Scorpion suture passing device was used to pass a SutureTape in a mattress fashion through the subscapularis tendon.  These were passed from the anterolateral portal and after passage the sutures were retrieved from the anterior portal. This portal did not allow for appropriate angle towards the lesser tuberosity for anchor placement so a lower, more lateral portal was placed. The SutureTapes were then retrieved through a cannula from the low anterior portal. These sutures were then passed through a 4.75 mm SwiveLock and placed into the lesser tuberosity at the footprint of the subscapularis tendon with the arm in a neutral position.   This appropriately reduced the subscapularis tear.  The arm was then internally and externally rotated and the subscapularis was noted to move appropriately with rotation.    Next, the arthroscope was then introduced into the subacromial space.    A direct lateral portal was created with an 11-blade after spinal needle localization. An extensive subacromial bursectomy was performed using a combination of the shaver and Arthrocare wand. The entire acromial undersurface was exposed and the CA ligament was subperiosteally elevated to expose the anterior acromial hook. A 5.63m barrel burr was used to create a flat anterior and lateral aspect of the acromion, converting it from a Type 2 to a Type 1 acromion. Care was made to keep the deltoid fascia intact.    I then turned my attention to the arthroscopic distal clavicle excision. I identified the acromioclavicular joint. Surrounding bursal tissue was debrided and the edges of the joint were identified. I used the 5.566mbarrel burr to remove the distal clavicle parallel to the edge of the acromion. I was able to fit two widths of the burr into the space between the distal clavicle and acromion, signifying that I had removed ~1149mf distal clavicle. This was confirmed by viewing anteriorly and introducing a probe with measuring marks from the lateral portal. Hemostasis was achieved with an Arthrocare wand. Findings of retracted, supraspinatus tear was confirmed.  Tissue superior and inferior to the rotator cuff was released, but despite this, the rotator cuff could not be mobilized to its footprint. Therefore decision was made to perform a rotator cuff reconstruction by superior capsular reconstruction with allograft patch.   Using an Arthrocare wand, the anterior, superior, and posterior aspects of the glenoid were cleaned of soft tissue as these regions would serve as points of fixation. Through the anterior portal, a Knotless SutureTak anchor was placed appropriately. Next, percutaneous incisions were made in  Neviaser's portal and posteriorly off the posterior edge of the acromion to place the superior and posterior Knotless  SutureTaks. Next, 2 medial row anchors from the Arthrex speed bridge kit were placed at the articular margin open (one anteriorly and one posteriorly).  Next the spaces between these anchors and the glenoid anchors were measured after the arm was abducted ~20 degrees.    Next, we made the mini-open approach. A longitudinal incision from the anterolateral acromion ~6cm in length was made overlying the raphe between the anterior and middle heads of the deltoid.  This incision also incorporated the anterolateral portal.  The raphe was identified and it was incised. The subacromial space was identified. Any remaining bursa was excised.    We then turned our attention to the biceps tenodesis. The arm was externally rotated.  The bicipital groove was identified.  A 15 blade was used to make a cut overlying the biceps tendon, and the tendon was removed using a right angle clamp.  The base of the bicipital groove was identified and cleared of soft tissue.  A FiberTak anchor was placed in the bicipital groove.  The biceps tendon was held at the appropriate amount of tension.  One set of sutures was passed through the biceps anchor with one limb passed in a simple fashion and the second limb passed in a simple plus locking stitch pattern.  This was repeated for the other set of sutures.  This construct allowed for shuttling the biceps tendon down to the bone.  The sutures were tied and cut.  The diseased portion of the proximal biceps was then excised.   Based on these prior measurements, the RTI 77m Matrix HD graft was cut appropriately.  The medial glenoid sutures were passed through the graft.  Using the knotless mechanism, the anterior anchor suture was shuttled. The superior glenoid anchor shuttling mechanism was decoupled from the anchor so decision was made to pass one limb of the passing suture from the superior and posterior anchor through their respective positions on the graft and to tie them to each other.  Next the medial row FiberTapes were passed appropriately through the graft.  The graft was then shuttled into an appropriate position by pulling on the anterior glenoid suture anchor and then using a knot pusher to tie the middle and posterior sutures together.  Next, the 2 Lateral Row swivel lock anchors were placed appropriately resulting in compression of the allograft on the rotator cuff footprint.  3 side-to-side sutures were placed between the posterior edge of the allograft and the infraspinatus.  The construct was stable with external and internal rotation, and it resisted superior migration of the humeral head.   The wound was thoroughly irrigated.  The deltoid split was closed with 0 Vicryl.  The subdermal layer was closed with 3-0 Vicryl.  The skin was closed with staples. The portals were closed with 3-0 Nylon. Xeroform was applied to the incisions. A sterile dressing was applied, followed by a Polar Care sleeve and a SlingShot shoulder immobilizer/sling. The patient was awakened from anesthesia without difficulty and was transferred to the PACU in stable condition.      COMPLICATIONS: none   DISPOSITION: plan for discharge home after recovery in PACU     POSTOPERATIVE PLAN: Remain in sling (except hygiene and elbow/wrist/hand RoM exercises as instructed by PT) x 6 weeks and NWB for this time. PT to begin 3-4 days after surgery.  Use massive rotator cuff repair/superior capsular reconstruction rehab protocol  with subscapularis repair and biceps tenodesis.  ASA 349m daily x 2 weeks for DVT ppx.

## 2019-03-08 NOTE — Transfer of Care (Signed)
Immediate Anesthesia Transfer of Care Note  [facility]: [facility]  Procedure(s) Performed: SHOULDER ARTHROSCOPY WITH OPEN ROTATOR CUFF REPAIR - LEFT - SUBSCAPULARIS MINI OPEN ROTATER CUFF REPAIR, SUERIOR RECONSTRUCTION DISTAL CLAVICLE EXCISION BICEPS TENODESIS (Left )  Patient Location: PACU  Anesthesia Type:General  Level of Consciousness: sedated  Airway & Oxygen Therapy: Patient connected to face mask oxygen  Post-op Assessment: Post -op Vital signs reviewed and stable  Post vital signs: stable  Last Vitals:  Vitals Value Taken Time  BP 108/54 03/08/2019  6:54 PM  Temp    Pulse 96 03/08/2019  6:56 PM  Resp 7 03/08/2019  6:56 PM  SpO2 98 % 03/08/2019  6:56 PM  Vitals shown include unvalidated device data.  Last Pain:  Vitals:   03/08/19 1036  TempSrc: Tympanic  PainSc: 0-No pain         Complications: No apparent anesthesia complications

## 2019-03-08 NOTE — H&P (Signed)
Paper H&P to be scanned into permanent record. H&P reviewed. No significant changes noted.  

## 2019-03-08 NOTE — Discharge Instructions (Signed)
AMBULATORY SURGERY  DISCHARGE INSTRUCTIONS   1) The drugs that you were given will stay in your system until tomorrow so for the next 24 hours you should not:  A) Drive an automobile B) Make any legal decisions C) Drink any alcoholic beverage   2) You may resume regular meals tomorrow.  Today it is better to start with liquids and gradually work up to solid foods.  You may eat anything you prefer, but it is better to start with liquids, then soup and crackers, and gradually work up to solid foods.   3) Please notify your doctor immediately if you have any unusual bleeding, trouble breathing, redness and pain at the surgery site, drainage, fever, or pain not relieved by medication. 4)   5) Your post-operative visit with Dr.                                     is: Date:                        Time:    Please call to schedule your post-operative visit.  6) Additional Instructions:       Post-Op Instructions - Rotator Cuff Repair  1. Bracing: You will wear a shoulder immobilizer or sling for 6 weeks.   2. Driving: No driving for 3 weeks post-op. When driving, do not wear the immobilizer. Ideally, we recommend no driving for 6 weeks while sling is in place as one arm will be immobilized.   3. Activity: No active lifting for 2 months. Wrist, hand, and elbow motion only. Avoid lifting the upper arm away from the body except for hygiene. You are permitted to bend and straighten the elbow passively only (no active elbow motion). You may use your hand and wrist for typing, writing, and managing utensils (cutting food). Do not lift more than a coffee cup for 8 weeks.  When sleeping or resting, inclined positions (recliner chair or wedge pillow) and a pillow under the forearm for support may provide better comfort for up to 4 weeks.  Avoid long distance travel for 4 weeks.  Return to normal activities after rotator cuff repair repair normally takes 6 months on average. If rehab goes very  well, may be able to do most activities at 4 months, except overhead or contact sports.  4. Physical Therapy: Begins 3-4 days after surgery, and proceed 1 time per week for the first 6 weeks, then 1-2 times per week from weeks 6-20 post-op.  5. Medications:  - You will be provided a prescription for narcotic pain medicine. After surgery, take 1-2 narcotic tablets every 4 hours if needed for severe pain.  - A prescription for anti-nausea medication will be provided in case the narcotic medicine causes nausea - take 1 tablet every 6 hours only if nauseated.   - Take tylenol 1000 mg (2 Extra Strength tablets or 3 regular strength) every 8 hours for pain.  May decrease or stop tylenol 5 days after surgery if you are having minimal pain. - Take ASA 325mg /day x 2 weeks to help prevent DVTs/PEs (blood clots).  - DO NOT take ANY nonsteroidal anti-inflammatory pain medications (Advil, Motrin, Ibuprofen, Aleve, Naproxen, or Naprosyn). These medicines can inhibit healing of your shoulder repair.    If you are taking prescription medication for anxiety, depression, insomnia, muscle spasm, chronic pain, or for attention deficit disorder, you  are advised that you are at a higher risk of adverse effects with use of narcotics post-op, including narcotic addiction/dependence, depressed breathing, death. If you use non-prescribed substances: alcohol, marijuana, cocaine, heroin, methamphetamines, etc., you are at a higher risk of adverse effects with use of narcotics post-op, including narcotic addiction/dependence, depressed breathing, death. You are advised that taking > 50 morphine milligram equivalents (MME) of narcotic pain medication per day results in twice the risk of overdose or death. For your prescription provided: oxycodone 5 mg - taking more than 6 tablets per day would result in > 50 morphine milligram equivalents (MME) of narcotic pain medication. Be advised that we will prescribe narcotics short-term,  for acute post-operative pain only - 3 weeks for major operations such as shoulder repair/reconstruction surgeries.     6. Post-Op Appointment:  Your first post-op appointment will be 10-14 days post-op.  7. Work or School: For most, but not all procedures, we advise staying out of work or school for at least 1 to 2 weeks in order to recover from the stress of surgery and to allow time for healing.   If you need a work or school note this can be provided.   8. Smoking: If you are a smoker, you need to refrain from smoking in the postoperative period. The nicotine in cigarettes will inhibit healing of your shoulder repair and decrease the chance of successful repair. Similarly, nicotine containing products (gum, patches) should be avoided.   Post-operative Brace: Apply and remove the brace you received as you were instructed to at the time of fitting and as described in detail as the braces instructions for use indicate.  Wear the brace for the period of time prescribed by your physician.  The brace can be cleaned with soap and water and allowed to air dry only.  Should the brace result in increased pain, decreased feeling (numbness/tingling), increased swelling or an overall worsening of your medical condition, please contact your doctor immediately.  If an emergency situation occurs as a result of wearing the brace after normal business hours, please dial 911 and seek immediate medical attention.  Let your doctor know if you have any further questions about the brace issued to you. Refer to the shoulder sling instructions for use if you have any questions regarding the correct fit of your shoulder sling.  [facility]oubleshooting: [phone]  Video that illustrates how to properly use a shoulder sling: "Instructions for Proper Use of an Orthopaedic Sling" [email or web address]

## 2019-03-08 NOTE — Anesthesia Procedure Notes (Signed)
Procedure Name: Intubation Date/Time: 03/08/2019 1:40 PM Performed by: Gentry Fitz, CRNA Pre-anesthesia Checklist: Patient identified, Emergency Drugs available, Suction available and Patient being monitored Patient Re-evaluated:Patient Re-evaluated prior to induction Oxygen Delivery Method: Circle system utilized Preoxygenation: Pre-oxygenation with 100% oxygen Induction Type: IV induction and Cricoid Pressure applied Ventilation: Mask ventilation without difficulty Laryngoscope Size: McGraph Tube size: 7.5 mm Number of attempts: 2 (grade 3 view with MAC 4, grade 1 with McGrath) Future Recommendations: Recommend- induction with short-acting agent, and alternative techniques readily available

## 2019-03-08 NOTE — Anesthesia Postprocedure Evaluation (Signed)
Anesthesia Post Note  Patient: Marc Caldwell [name]  Procedure(s) Performed: SHOULDER ARTHROSCOPY WITH OPEN ROTATOR CUFF REPAIR - LEFT - SUBSCAPULARIS MINI OPEN ROTATER CUFF REPAIR, SUERIOR RECONSTRUCTION DISTAL CLAVICLE EXCISION BICEPS TENODESIS (Left )  Patient location during evaluation: PACU Anesthesia Type: Regional Level of consciousness: awake and alert Pain management: pain level controlled Vital Signs Assessment: post-procedure vital signs reviewed and stable Respiratory status: spontaneous breathing, nonlabored ventilation, respiratory function stable and patient connected to nasal cannula oxygen Cardiovascular status: blood pressure returned to baseline and stable Postop Assessment: no apparent nausea or vomiting Anesthetic complications: no     Last Vitals:  Vitals:   03/08/19 1955 03/08/19 2050  BP: (!) 141/64 (!) 117/46  Pulse: 99 93  Resp: 12 18  Temp: (!) 36.3 C   SpO2: 95% 96%    Last Pain:  Vitals:   03/08/19 2050  TempSrc:   PainSc: 4                  Precious Haws Piscitello

## 2019-03-09 ENCOUNTER — Encounter: Payer: Self-pay | Admitting: Orthopedic Surgery

## 2019-03-12 ENCOUNTER — Encounter: Payer: Self-pay | Admitting: Orthopedic Surgery

## 2019-03-13 MED ORDER — LIDOCAINE HCL (PF) 1 % IJ SOLN
INTRAMUSCULAR | Status: DC | PRN
  Administered 2019-03-08: 1 mL

## 2019-03-13 MED ORDER — BUPIVACAINE LIPOSOME 1.3 % IJ SUSP
INTRAMUSCULAR | Status: DC | PRN
  Administered 2019-03-08 (×2): 10 mL

## 2019-03-13 MED ORDER — BUPIVACAINE HCL (PF) 0.5 % IJ SOLN
INTRAMUSCULAR | Status: DC | PRN
  Administered 2019-03-08 (×2): 5 mL

## 2019-03-13 NOTE — Anesthesia Procedure Notes (Signed)
Anesthesia Regional Block: Interscalene brachial plexus block   Pre-Anesthetic Checklist: ,, timeout performed, Correct Patient, Correct Site, Correct Laterality, Correct Procedure, Correct Position, site marked, Risks and benefits discussed,  Surgical consent,  Pre-op evaluation,  At surgeon's request and post-op pain management  Laterality: Left  Prep: chloraprep       Needles:   Needle Type: Stimiplex     Needle Length: 9cm  Needle Gauge: 21     Additional Needles:   Procedures:,,,, ultrasound used (permanent image in chart),,,,  Narrative:  Start time: 03/13/2019 1:00 PM End time: 03/13/2019 1:05 PM  Performed by: Personally  Anesthesiologist: Durenda Hurt, MD  Additional Notes: Negative aspiration.  Negative paresthesia on injection.  Dose given in divided aliquots under ultrasound guidance.

## 2019-03-13 NOTE — Addendum Note (Signed)
Addendum  created 03/13/19 0914 by Durenda Hurt, MD   Child order released for a procedure order, Clinical Note Signed, Intraprocedure Blocks edited, Intraprocedure Meds edited

## 2019-03-20 ENCOUNTER — Encounter: Payer: Self-pay | Admitting: Orthopedic Surgery

## 2019-03-21 ENCOUNTER — Ambulatory Visit: Payer: Medicare Other | Admitting: Urology

## 2019-05-10 ENCOUNTER — Ambulatory Visit: Payer: Medicare Other | Admitting: Urology

## 2019-05-18 ENCOUNTER — Other Ambulatory Visit: Payer: Self-pay

## 2019-05-18 ENCOUNTER — Encounter: Payer: Self-pay | Admitting: Urology

## 2019-05-18 ENCOUNTER — Ambulatory Visit (INDEPENDENT_AMBULATORY_CARE_PROVIDER_SITE_OTHER): Payer: Medicare Other | Admitting: Urology

## 2019-05-18 VITALS — BP 124/71 | HR 86 | Ht 71.0 in | Wt 235.0 lb

## 2019-05-18 DIAGNOSIS — N401 Enlarged prostate with lower urinary tract symptoms: Secondary | ICD-10-CM | POA: Diagnosis not present

## 2019-05-18 DIAGNOSIS — N402 Nodular prostate without lower urinary tract symptoms: Secondary | ICD-10-CM | POA: Diagnosis not present

## 2019-05-18 MED ORDER — TAMSULOSIN HCL 0.4 MG PO CAPS: 0.4000 mg | ORAL_CAPSULE | Freq: Every day | ORAL | 3 refills | Status: DC

## 2019-05-18 NOTE — Progress Notes (Signed)
05/18/2019 10:28 AM   [place] [birth year] [id number]  Referring provider: Derinda Late, MD [phone] [facility] [place],  [place] [address]  Chief Complaint  Patient presents with  . Benign Prostatic Hypertrophy    Urologic history: 1.  Benign prostate nodule  -Previous negative biopsy Dr. Yves Dill  2.  BPH with lower urinary tract symptoms  -On tamsulosin  HPI: 68 year old male with a history of prostate nodule with a benign biopsy previously followed by Dr. Yves Dill.  Refer to my previous note of 02/15/2018.  He has no complaints.  He remains on tamsulosin and denies bothersome lower urinary tract symptoms.  PSA performed March 2020 by his PCP was stable at 1.27.   PMH: Past Medical History:  Diagnosis Date  . Allergic rhinitis   . Arthritis   . Cancer ([place])    basal cell skin ca  . Depressive disorder   . GERD (gastroesophageal reflux disease)   . Hyperlipidemia   . Hypertension     Surgical History: Past Surgical History:  Procedure Laterality Date  . COLONOSCOPY WITH PROPOFOL N/A 12/03/2016   Procedure: COLONOSCOPY WITH PROPOFOL;  Surgeon: Manya Silvas, MD;  Location: [facility] ENDOSCOPY;  Service: Endoscopy;  Laterality: N/A;  . HERNIA REPAIR    . SHOULDER ARTHROSCOPY WITH OPEN ROTATOR CUFF REPAIR Left 03/08/2019   Procedure: SHOULDER ARTHROSCOPY WITH OPEN ROTATOR CUFF REPAIR - LEFT - SUBSCAPULARIS MINI OPEN ROTATER CUFF REPAIR, SUERIOR RECONSTRUCTION DISTAL CLAVICLE EXCISION BICEPS TENODESIS;  Surgeon: Leim Fabry, MD;  Location: [facility] ORS;  Service: Orthopedics;  Laterality: Left;    Home Medications:  Allergies as of 05/18/2019      Reactions   Niacin And Related Other (See Comments)   Flushing    Penicillin G Other (See Comments)   Did it involve swelling of the face/tongue/throat, SOB, or low BP? No Did it involve sudden or severe rash/hives, skin peeling, or any reaction on the inside of your mouth or  nose? Yes Did you need to seek medical attention at a hospital or doctor's office? Yes When did it last happen?childhood If all above answers are "NO", may proceed with cephalosporin use.      Medication List       Accurate as of May 18, 2019 10:28 AM. If you have any questions, ask your nurse or doctor.        STOP taking these medications   ondansetron 4 MG disintegrating tablet Commonly known as: Zofran ODT Stopped by: Abbie Sons, MD   oxyCODONE 5 MG immediate release tablet Commonly known as: Roxicodone Stopped by: Abbie Sons, MD     TAKE these medications   acetaminophen 500 MG tablet Commonly known as: TYLENOL Take 2 tablets (1,000 mg total) by mouth every 8 (eight) hours.   aspirin EC 325 MG tablet Take by mouth.   cetirizine 10 MG tablet Commonly known as: ZYRTEC Take 10 mg by mouth every morning.   diphenhydrAMINE 25 mg capsule Commonly known as: BENADRYL Take 25 mg by mouth every morning.   esomeprazole 40 MG capsule Commonly known as: NEXIUM Take 40 mg by mouth daily before breakfast.   Fish Oil 600 MG Caps Take 1 tablet by mouth 2 (two) times daily.   Garlic 149 MG Tabs Take 100 mg by mouth 2 (two) times a day.   GLUCOSAMINE CHOND MSM FORMULA PO Take 1 tablet by mouth 2 (two) times a day.   hydrochlorothiazide 25  MG tablet Commonly known as: HYDRODIURIL   lisinopril 20 MG tablet Commonly known as: ZESTRIL Take 20 mg by mouth every morning.   lisinopril-hydrochlorothiazide 20-25 MG tablet Commonly known as: ZESTORETIC Take 1 tablet by mouth daily.   losartan 100 MG tablet Commonly known as: COZAAR   multivitamin tablet Take 1 tablet by mouth daily.   naftifine 1 % cream Commonly known as: NAFTIN Apply 1 application topically daily as needed (irritation).   PARoxetine 20 MG tablet Commonly known as: PAXIL Take 20 mg by mouth every morning.   simvastatin 20 MG tablet Commonly known as: ZOCOR Take 20 mg by mouth  daily at 6 PM.       Allergies:  Allergies  Allergen Reactions  . Niacin And Related Other (See Comments)    Flushing   . Penicillin G Other (See Comments)    Did it involve swelling of the face/tongue/throat, SOB, or low BP? No Did it involve sudden or severe rash/hives, skin peeling, or any reaction on the inside of your mouth or nose? Yes Did you need to seek medical attention at a hospital or doctor's office? Yes When did it last happen?childhood If all above answers are "NO", may proceed with cephalosporin use.     Family History: Family History  Problem Relation Age of Onset  . Prostate cancer Neg Hx   . Kidney cancer Neg Hx   . Bladder Cancer Neg Hx     Social History:  reports that he has never smoked. He has never used smokeless tobacco. He reports current alcohol use. He reports that he does not use drugs.  ROS: UROLOGY Frequent Urination?: No Hard to postpone urination?: No Burning/pain with urination?: No Get up at night to urinate?: No Leakage of urine?: No Urine stream starts and stops?: No Trouble starting stream?: No Do you have to strain to urinate?: No Blood in urine?: No Urinary tract infection?: No Sexually transmitted disease?: No Injury to kidneys or bladder?: No Painful intercourse?: No Weak stream?: No Erection problems?: No Penile pain?: No  Gastrointestinal Nausea?: No Vomiting?: No Indigestion/heartburn?: No Diarrhea?: No Constipation?: No  Constitutional Fever: No Night sweats?: No Weight loss?: No Fatigue?: No  Skin Skin rash/lesions?: No Itching?: No  Eyes Blurred vision?: No Double vision?: No  Ears/Nose/Throat Sore throat?: No Sinus problems?: No  Hematologic/Lymphatic Swollen glands?: No Easy bruising?: No  Cardiovascular Leg swelling?: No Chest pain?: No  Respiratory Cough?: No Shortness of breath?: No  Endocrine Excessive thirst?: No  Musculoskeletal Back pain?: No Joint pain?: No   Neurological Headaches?: No Dizziness?: No  Psychologic Depression?: No Anxiety?: No  Physical Exam: BP 124/71 (BP Location: Left Arm, Patient Position: Sitting, Cuff Size: Normal)   Pulse 86   Ht 5\' 11"  (1.803 m)   Wt 235 lb (106.6 kg)   BMI 32.78 kg/m   Constitutional:  Alert and oriented, No acute distress. HEENT: [place] AT, moist mucus membranes.  Trachea midline, no masses. Cardiovascular: No clubbing, cyanosis, or edema. Respiratory: Normal respiratory effort, no increased work of breathing. GI: Abdomen is soft, nontender, nondistended, no abdominal masses GU: No CVA tenderness. Prostate 40 g with a soft, cystic area in the mid prostate just to the left of midline. Lymph: No cervical or inguinal lymphadenopathy. Skin: No rashes, bruises or suspicious lesions. Neurologic: Grossly intact, no focal deficits, moving all 4 extremities. Psychiatric: Normal mood and affect.   Assessment & Plan:    - Prostate nodule Benign nodule without change.  PSA is  stable.  - BPH with lower urinary tract symptoms Stable voiding symptoms on tamsulosin which was refilled.  Continue annual follow-up   Abbie Sons, MD  [facility] [address], [place], [place] [address] [phone]

## 2019-06-08 ENCOUNTER — Ambulatory Visit: Payer: Medicare Other | Admitting: Urology

## 2019-07-19 ENCOUNTER — Encounter: Payer: Self-pay | Admitting: Orthopedic Surgery

## 2020-05-12 ENCOUNTER — Telehealth: Payer: Self-pay | Admitting: Urology

## 2020-05-12 NOTE — Telephone Encounter (Signed)
Pt has upcoming appt but states he will run out of Flomax before his appt. Only has 1 pill left.  Would like a Rx sent to [facility] in [place]. Please advise.

## 2020-05-13 ENCOUNTER — Other Ambulatory Visit: Payer: Self-pay | Admitting: *Deleted

## 2020-05-13 DIAGNOSIS — N401 Enlarged prostate with lower urinary tract symptoms: Secondary | ICD-10-CM

## 2020-05-13 MED ORDER — TAMSULOSIN HCL 0.4 MG PO CAPS: 0.4000 mg | ORAL_CAPSULE | Freq: Every day | ORAL | 3 refills | Status: DC

## 2020-05-14 ENCOUNTER — Other Ambulatory Visit: Payer: Self-pay | Admitting: Urology

## 2020-05-14 DIAGNOSIS — N401 Enlarged prostate with lower urinary tract symptoms: Secondary | ICD-10-CM

## 2020-05-14 MED ORDER — TAMSULOSIN HCL 0.4 MG PO CAPS: 0.4000 mg | ORAL_CAPSULE | Freq: Every day | ORAL | 0 refills | Status: DC

## 2020-05-20 ENCOUNTER — Ambulatory Visit: Payer: Medicare Other | Admitting: Urology

## 2020-05-21 ENCOUNTER — Ambulatory Visit: Payer: Medicare Other | Admitting: Urology

## 2020-05-26 ENCOUNTER — Ambulatory Visit: Payer: TRICARE For Life (TFL) | Admitting: Urology

## 2020-06-04 NOTE — Progress Notes (Signed)
06/05/2020 10:54 AM   [place] [birth year] [id number]  Referring provider: Derinda Late, MD [phone] [address] [place],  [place] [address] Chief Complaint  Patient presents with  . Other    Urologic history: 1.  Benign prostate nodule             -Previous negative biopsy Dr. Yves Dill  2.  BPH with lower urinary tract symptoms             -On tamsulosin  HPI: Marc Caldwell is a 69 y.o. male who presents today for an annual follow up of prostate nodule and BPH w/ LUTS.  -He remains on tamsulosin. -Stable voiding symptoms. -PSA  performed by PCP stable at 1.27 on 05/07/2020 -PVR is 0 mL.    PMH: Past Medical History:  Diagnosis Date  . Allergic rhinitis   . Arthritis   . Cancer ([place])    basal cell skin ca  . Depressive disorder   . GERD (gastroesophageal reflux disease)   . Hyperlipidemia   . Hypertension     Surgical History: Past Surgical History:  Procedure Laterality Date  . COLONOSCOPY WITH PROPOFOL N/A 12/03/2016   Procedure: COLONOSCOPY WITH PROPOFOL;  Surgeon: Manya Silvas, MD;  Location: [facility] ENDOSCOPY;  Service: Endoscopy;  Laterality: N/A;  . HERNIA REPAIR    . SHOULDER ARTHROSCOPY WITH OPEN ROTATOR CUFF REPAIR Left 03/08/2019   Procedure: SHOULDER ARTHROSCOPY WITH OPEN ROTATOR CUFF REPAIR - LEFT - SUBSCAPULARIS MINI OPEN ROTATER CUFF REPAIR, SUERIOR RECONSTRUCTION DISTAL CLAVICLE EXCISION BICEPS TENODESIS;  Surgeon: Leim Fabry, MD;  Location: [facility] ORS;  Service: Orthopedics;  Laterality: Left;    Home Medications:  Allergies as of 06/05/2020      Reactions   Niacin And Related Other (See Comments)   Flushing    Penicillin G Other (See Comments)   Did it involve swelling of the face/tongue/throat, SOB, or low BP? No Did it involve sudden or severe rash/hives, skin peeling, or any reaction on the inside of your mouth or nose? Yes Did you need to seek medical attention at a hospital  or doctor's office? Yes When did it last happen?childhood If all above answers are "NO", may proceed with cephalosporin use.      Medication List       Accurate as of June 05, 2020 10:54 AM. If you have any questions, ask your nurse or doctor.        cetirizine 10 MG tablet Commonly known as: ZYRTEC Take 10 mg by mouth every morning.   diphenhydrAMINE 25 mg capsule Commonly known as: BENADRYL Take 25 mg by mouth every morning.   esomeprazole 40 MG capsule Commonly known as: NEXIUM Take 40 mg by mouth daily before breakfast.   Fish Oil 600 MG Caps Take 1 tablet by mouth 2 (two) times daily.   Garlic 947 MG Tabs Take 100 mg by mouth 2 (two) times a day.   GLUCOSAMINE CHOND MSM FORMULA PO Take 1 tablet by mouth 2 (two) times a day.   hydrochlorothiazide 25 MG tablet Commonly known as: HYDRODIURIL   lisinopril 20 MG tablet Commonly known as: ZESTRIL Take 20 mg by mouth every morning.   lisinopril-hydrochlorothiazide 20-25 MG tablet Commonly known as: ZESTORETIC Take 1 tablet by mouth daily.   losartan 100 MG tablet Commonly known as: COZAAR   multivitamin tablet Take 1 tablet by mouth daily.   naftifine 1 % cream Commonly known as: NAFTIN Apply  1 application topically daily as needed (irritation).   PARoxetine 20 MG tablet Commonly known as: PAXIL Take 20 mg by mouth every morning.   simvastatin 20 MG tablet Commonly known as: ZOCOR Take 20 mg by mouth daily at 6 PM.   tamsulosin 0.4 MG Caps capsule Commonly known as: FLOMAX Take 1 capsule (0.4 mg total) by mouth daily.       Allergies:  Allergies  Allergen Reactions  . Niacin And Related Other (See Comments)    Flushing   . Penicillin G Other (See Comments)    Did it involve swelling of the face/tongue/throat, SOB, or low BP? No Did it involve sudden or severe rash/hives, skin peeling, or any reaction on the inside of your mouth or nose? Yes Did you need to seek medical attention at  a hospital or doctor's office? Yes When did it last happen?childhood If all above answers are "NO", may proceed with cephalosporin use.     Family History: Family History  Problem Relation Age of Onset  . Prostate cancer Neg Hx   . Kidney cancer Neg Hx   . Bladder Cancer Neg Hx     Social History:  reports that he has never smoked. He has never used smokeless tobacco. He reports current alcohol use. He reports that he does not use drugs.   Physical Exam: BP 129/77   Pulse 82   Ht 5\' 11"  (1.803 m)   Wt 240 lb (108.9 kg)   BMI 33.47 kg/m   Constitutional:  Alert and oriented, No acute distress. HEENT: [place] AT, moist mucus membranes.  Trachea midline, no masses. Cardiovascular: No clubbing, cyanosis, or edema. Respiratory: Normal respiratory effort, no increased work of breathing. GI: Abdomen is soft, nontender, nondistended, no abdominal masses GU: No CVA tenderness Lymph: No cervical or inguinal lymphadenopathy. Rectal: Normal sphincter tone, 40 g prostate with a soft, cystic area in the mid prostate just to the left of midline.  Skin: No rashes, bruises or suspicious lesions. Neurologic: Grossly intact, no focal deficits, moving all 4 extremities. Psychiatric: Normal mood and affect.   Pertinent Imaging: Results for orders placed or performed in visit on 06/05/20  Bladder Scan (Post Void Residual) in office  Result Value Ref Range   Scan Result 0      Assessment & Plan:    1. Prostate nodule Benign nodule without change. PSA is 1.27 as of 05/07/20. We discussed current prostate cancer screening guidelines between ages of 49-69 and in healthy patients to age 47.  He would like to continue annual screening  2. BPH with lower urinary tract symptoms  Stable voiding symptoms on Flomax and refill was sent. PVR is 0 mL.    [facility] [address], [place], [place] [address] [phone]  I, Selena Batten, am acting as  a scribe for Dr. Nicki Reaper C. Yaslin Kirtley,  I have reviewed the above documentation for accuracy and completeness, and I agree with the above.    Abbie Sons, MD

## 2020-06-05 ENCOUNTER — Ambulatory Visit (INDEPENDENT_AMBULATORY_CARE_PROVIDER_SITE_OTHER): Payer: Medicare PPO | Admitting: Urology

## 2020-06-05 ENCOUNTER — Other Ambulatory Visit: Payer: Self-pay

## 2020-06-05 ENCOUNTER — Encounter: Payer: Self-pay | Admitting: Urology

## 2020-06-05 VITALS — BP 129/77 | HR 82 | Ht 71.0 in | Wt 240.0 lb

## 2020-06-05 DIAGNOSIS — N402 Nodular prostate without lower urinary tract symptoms: Secondary | ICD-10-CM

## 2020-06-05 DIAGNOSIS — N401 Enlarged prostate with lower urinary tract symptoms: Secondary | ICD-10-CM

## 2020-06-05 LAB — BLADDER SCAN AMB NON-IMAGING: Scan Result: 0

## 2020-06-05 MED ORDER — TAMSULOSIN HCL 0.4 MG PO CAPS: 0.4000 mg | ORAL_CAPSULE | Freq: Every day | ORAL | 3 refills | Status: DC

## 2020-10-06 IMAGING — MR MRI OF THE LEFT SHOULDER WITHOUT CONTRAST
5 series · 32 of 40 positions shown · non-contrast
Comparison: None.

CLINICAL DATA: Persistent left shoulder pain and limited range of
motion since fall in [REDACTED].

EXAM:
MRI OF THE LEFT SHOULDER WITHOUT CONTRAST
TECHNIQUE: Multiplanar, multisequence MR imaging of the shoulder was performed.
No intravenous contrast was administered.

[Series 6: PD fat-sat · axial · left · 4.0mm · 0.55mm/px · z∈[-81,+48]mm · 8 of 28 slices shown (1 of 2)]
[im 1/28]
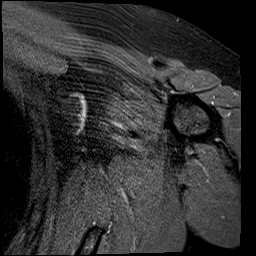
[im 4/28]
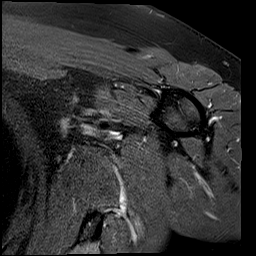
[im 10/28]
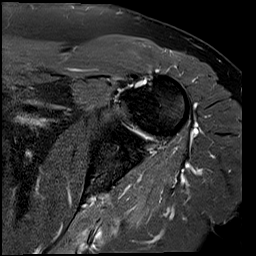
[im 13/28]
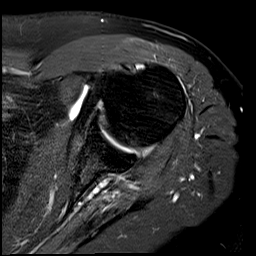
[im 16/28]
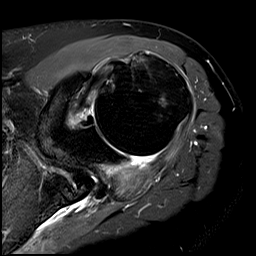
[im 19/28]
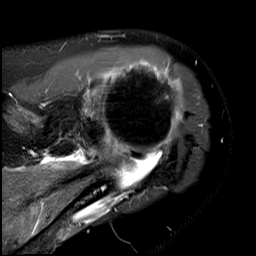
[im 25/28]
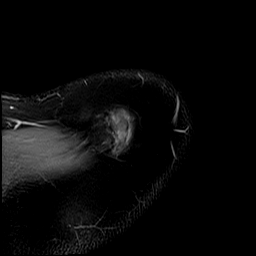
[im 28/28]
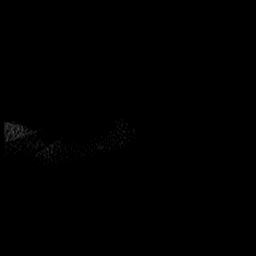

[Series 7: PD fat-sat · oblique · left · 4.0mm · 0.44mm/px · 8 of 26 slices shown (2 of 2)]
[im 1/26]
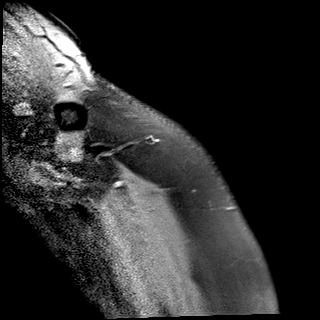
[im 4/26]
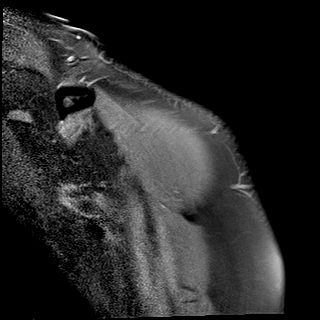
[im 8/26]
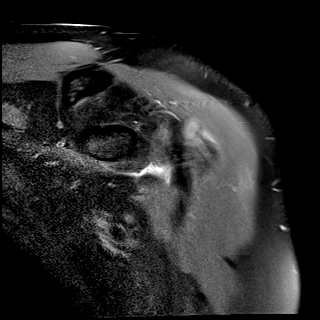
[im 11/26]
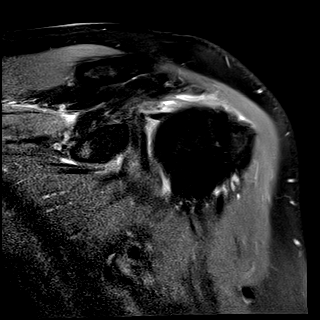
[im 15/26]
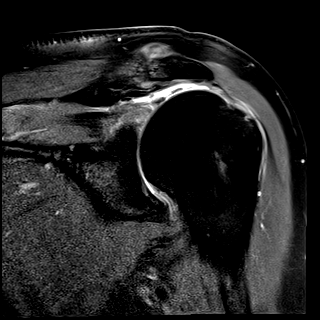
[im 18/26]
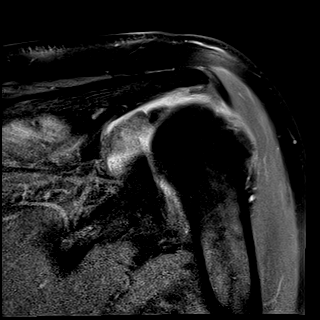
[im 22/26]
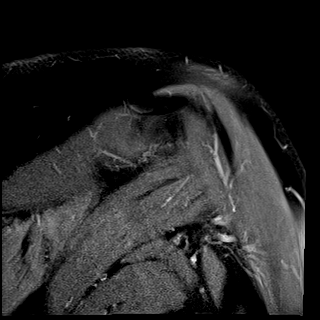
[im 26/26]
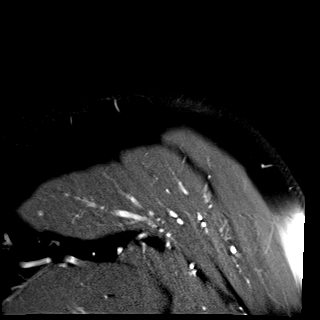

[Series 8: T2 fat-sat · oblique · left · 4.0mm · 0.44mm/px · 8 of 26 slices shown (1 of 2)]
[im 1/26]
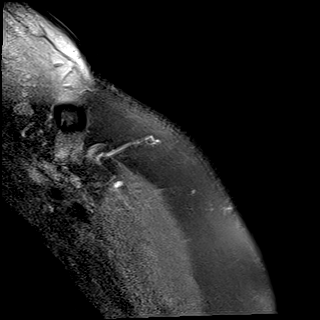
[im 4/26]
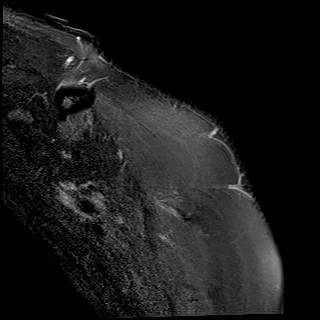
[im 8/26]
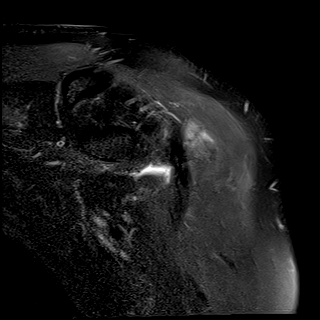
[im 11/26]
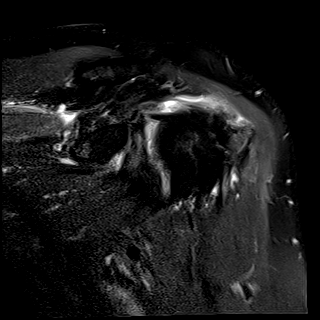
[im 15/26]
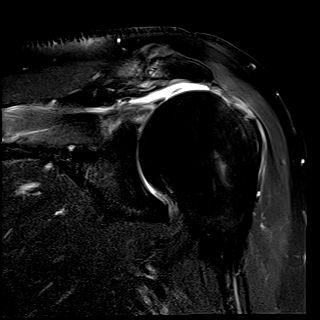
[im 18/26]
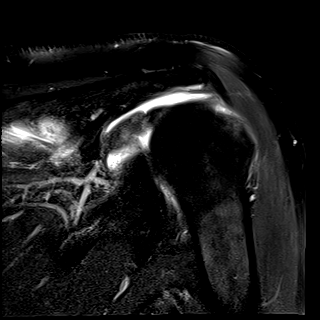
[im 22/26]
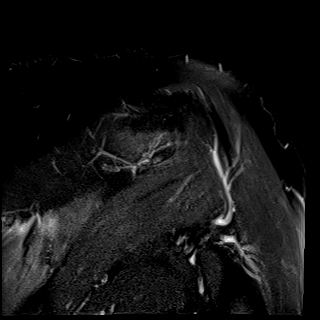
[im 26/26]
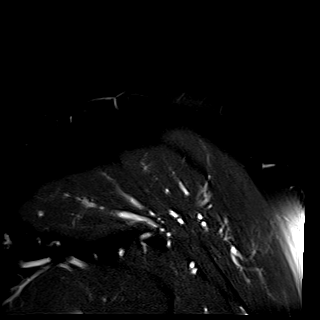

[Series 9: T2 fat-sat · oblique · left · 4.0mm · 0.23mm/px · 7 of 22 slices shown (2 of 2)]
[im 1/22]
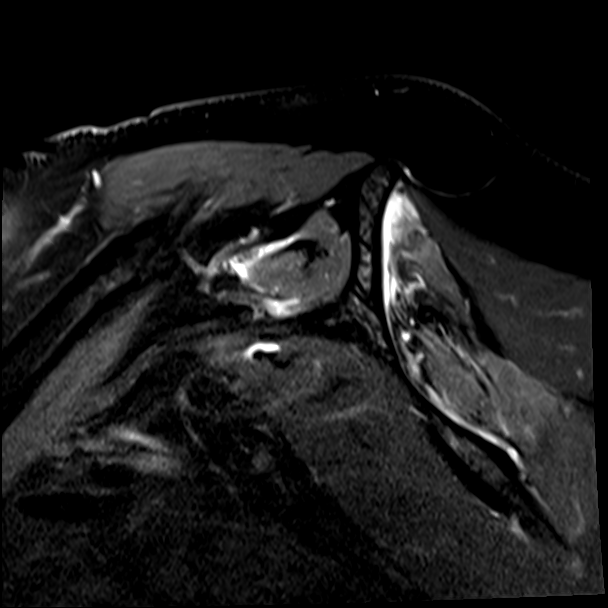
[im 4/22]
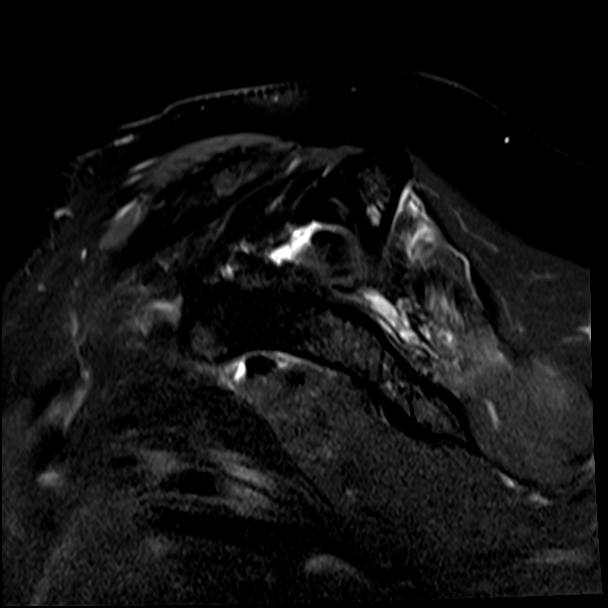
[im 8/22]
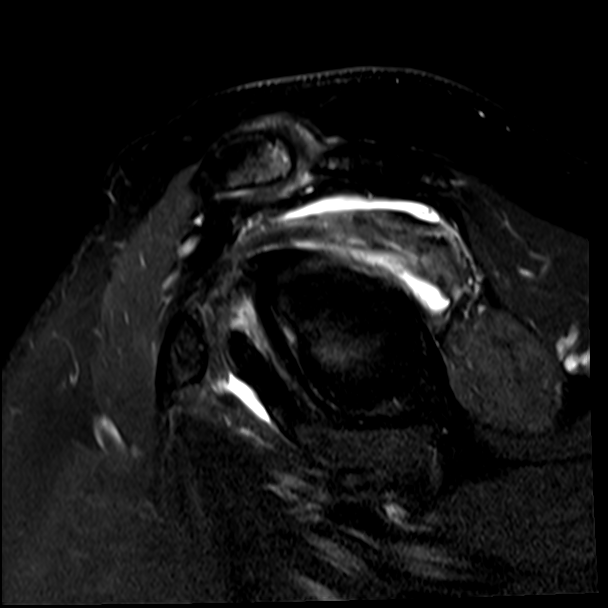
[im 11/22]
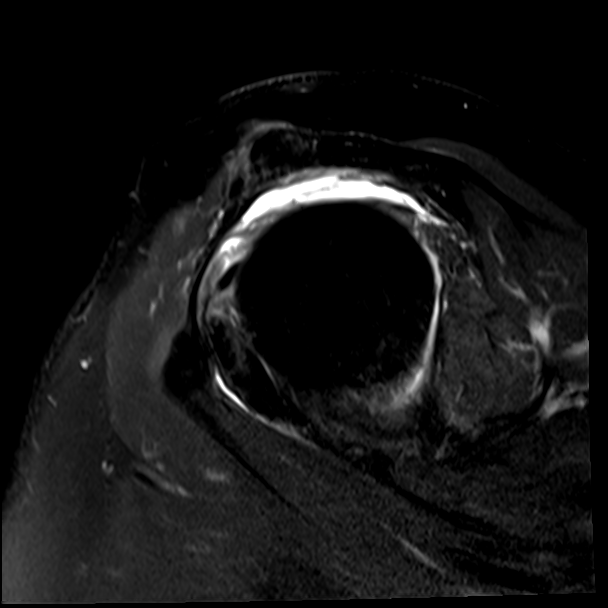
[im 15/22]
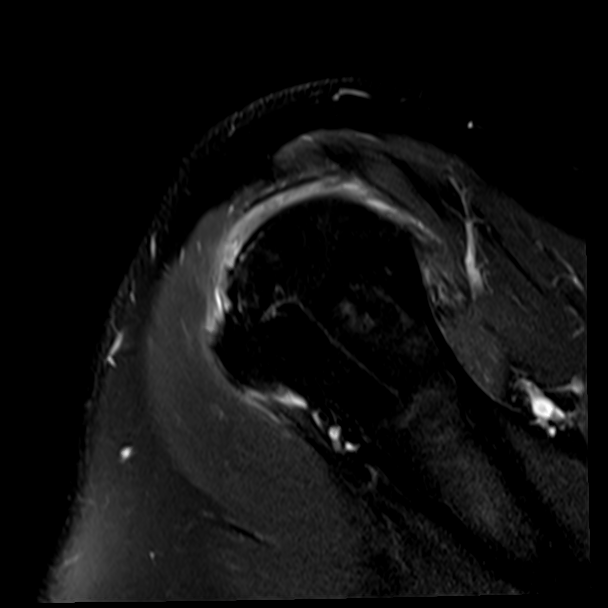
[im 18/22]
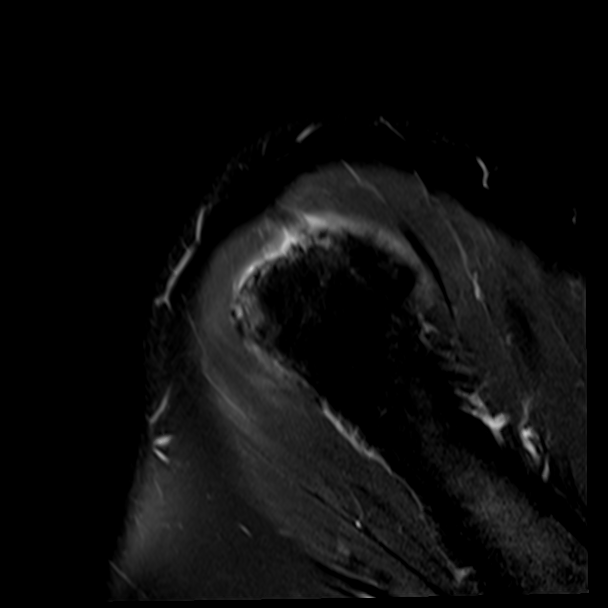
[im 22/22]
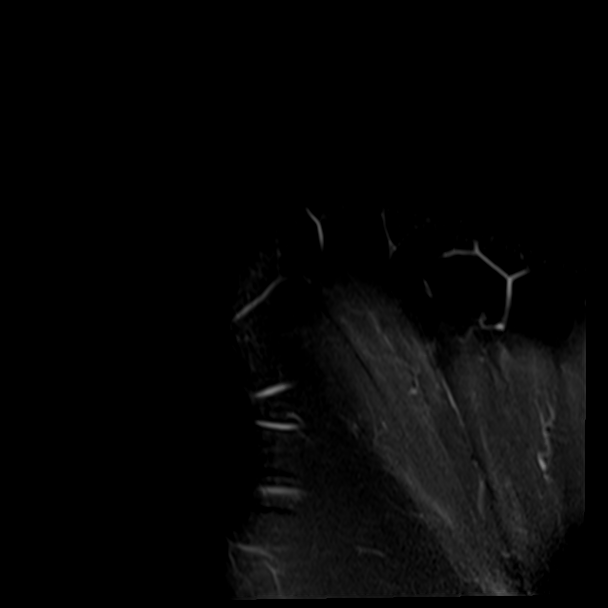

[Series 10: T1 · oblique · left · 4.0mm · 0.36mm/px · 1 of 22 slices shown]
[im 1/22]
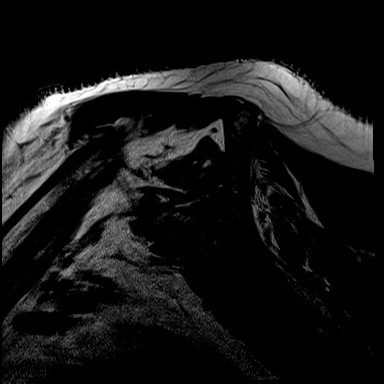

[32 of 40 positions shown; findings below may reference images not displayed]

FINDINGS: Rotator cuff: Full-thickness, full width tears of the supraspinatus
and infraspinatus tendons with over 4 cm of retraction to the
glenohumeral articulation. Mild subscapularis tendinosis. The teres
minor tendon is unremarkable.

Muscles: Supraspinatus and infraspinatus perimuscular edema. Mild
infraspinatus muscle atrophy.

Biceps long head:  Intact and normally positioned.

Acromioclavicular Joint: Mild arthropathy of the acromioclavicular
joint. Type II acromion. Small subacromial/subdeltoid bursal fluid.

Glenohumeral Joint: Small joint effusion. Mild diffuse cartilage
thinning without focal defect.

Labrum: Grossly intact, but evaluation is limited by lack of
intraarticular fluid.

Bones:  No acute fracture or dislocation.  No focal bone lesion.

Other: None.
IMPRESSION: 1. Full-thickness, full width tears of the supraspinatus and
infraspinatus tendons with over 4 cm of retraction. Mild
infraspinatus muscle atrophy.
2. Mild acromioclavicular and glenohumeral osteoarthritis.

## 2021-06-05 ENCOUNTER — Other Ambulatory Visit: Payer: Self-pay

## 2021-06-05 ENCOUNTER — Encounter: Payer: Self-pay | Admitting: Urology

## 2021-06-05 ENCOUNTER — Ambulatory Visit (INDEPENDENT_AMBULATORY_CARE_PROVIDER_SITE_OTHER): Payer: Medicare PPO | Admitting: Urology

## 2021-06-05 VITALS — BP 138/70 | HR 82 | Ht 74.0 in | Wt 240.0 lb

## 2021-06-05 DIAGNOSIS — N401 Enlarged prostate with lower urinary tract symptoms: Secondary | ICD-10-CM | POA: Diagnosis not present

## 2021-06-05 LAB — BLADDER SCAN AMB NON-IMAGING: SCA Result: 20

## 2021-06-05 NOTE — Progress Notes (Signed)
06/05/2021 11:27 AM   Marc Caldwell [place] 10/08/1950 [id number]  Referring provider: Derinda Late, MD [phone] [address] [place],  [place] [address]  Chief Complaint  Patient presents with   Benign Prostatic Hypertrophy    Urologic history: 1.  Benign prostate nodule             -Previous negative biopsy Dr. Yves Dill   2.  BPH with lower urinary tract symptoms             -On tamsulosin  HPI: 70 y.o. male presents for annual follow-up.  Doing well since last visit No bothersome LUTS Remains on tamsulosin Denies dysuria, gross hematuria Denies flank, abdominal or pelvic pain Scheduled to have his PSA drawn next month with his PCP     PMH: Past Medical History:  Diagnosis Date   Allergic rhinitis    Arthritis    Cancer ([place])    basal cell skin ca   Depressive disorder    GERD (gastroesophageal reflux disease)    Hyperlipidemia    Hypertension     Surgical History: Past Surgical History:  Procedure Laterality Date   COLONOSCOPY WITH PROPOFOL N/A 12/03/2016   Procedure: COLONOSCOPY WITH PROPOFOL;  Surgeon: Manya Silvas, MD;  Location: [facility] ENDOSCOPY;  Service: Endoscopy;  Laterality: N/A;   HERNIA REPAIR     SHOULDER ARTHROSCOPY WITH OPEN ROTATOR CUFF REPAIR Left 03/08/2019   Procedure: SHOULDER ARTHROSCOPY WITH OPEN ROTATOR CUFF REPAIR - LEFT - SUBSCAPULARIS MINI OPEN ROTATER CUFF REPAIR, SUERIOR RECONSTRUCTION DISTAL CLAVICLE EXCISION BICEPS TENODESIS;  Surgeon: Leim Fabry, MD;  Location: [facility] ORS;  Service: Orthopedics;  Laterality: Left;    Home Medications:  Allergies as of 06/05/2021       Reactions   Niacin And Related Other (See Comments)   Flushing    Penicillin G Other (See Comments)   Did it involve swelling of the face/tongue/throat, SOB, or low BP? No Did it involve sudden or severe rash/hives, skin peeling, or any reaction on the inside of your mouth or nose? Yes Did you need to seek medical  attention at a hospital or doctor's office? Yes When did it last happen?     childhood  If all above answers are "NO", may proceed with cephalosporin use.        Medication List        Accurate as of June 05, 2021 11:27 AM. If you have any questions, ask your nurse or doctor.          cetirizine 10 MG tablet Commonly known as: ZYRTEC Take 10 mg by mouth every morning.   diphenhydrAMINE 25 mg capsule Commonly known as: BENADRYL Take 25 mg by mouth every morning.   esomeprazole 40 MG capsule Commonly known as: NEXIUM Take 40 mg by mouth daily before breakfast.   Fish Oil 600 MG Caps Take 1 tablet by mouth 2 (two) times daily.   Garlic 123XX123 MG Tabs Take 100 mg by mouth 2 (two) times a day.   GLUCOSAMINE CHOND MSM FORMULA PO Take 1 tablet by mouth 2 (two) times a day.   hydrochlorothiazide 25 MG tablet Commonly known as: HYDRODIURIL   lisinopril 20 MG tablet Commonly known as: ZESTRIL Take 20 mg by mouth every morning.   lisinopril-hydrochlorothiazide 20-25 MG tablet Commonly known as: ZESTORETIC Take 1 tablet by mouth daily.   losartan 100 MG tablet Commonly known as: COZAAR   multivitamin tablet Take 1 tablet by mouth  daily.   naftifine 1 % cream Commonly known as: NAFTIN Apply 1 application topically daily as needed (irritation).   PARoxetine 20 MG tablet Commonly known as: PAXIL Take 20 mg by mouth every morning.   simvastatin 20 MG tablet Commonly known as: ZOCOR Take 20 mg by mouth daily at 6 PM.   tamsulosin 0.4 MG Caps capsule Commonly known as: FLOMAX Take 1 capsule (0.4 mg total) by mouth daily.        Allergies:  Allergies  Allergen Reactions   Niacin And Related Other (See Comments)    Flushing    Penicillin G Other (See Comments)    Did it involve swelling of the face/tongue/throat, SOB, or low BP? No Did it involve sudden or severe rash/hives, skin peeling, or any reaction on the inside of your mouth or nose? Yes Did you  need to seek medical attention at a hospital or doctor's office? Yes When did it last happen?     childhood  If all above answers are "NO", may proceed with cephalosporin use.     Family History: Family History  Problem Relation Age of Onset   Prostate cancer Neg Hx    Kidney cancer Neg Hx    Bladder Cancer Neg Hx     Social History:  reports that he has never smoked. He has never used smokeless tobacco. He reports current alcohol use. He reports that he does not use drugs.   Physical Exam: BP 138/70   Pulse 82   Ht '6\' 2"'$  (1.88 m)   Wt 240 lb (108.9 kg)   BMI 30.81 kg/m   Constitutional:  Alert and oriented, No acute distress. HEENT: [place] AT, moist mucus membranes.  Trachea midline, no masses. Cardiovascular: No clubbing, cyanosis, or edema. Respiratory: Normal respiratory effort, no increased work of breathing. GU: DRE every other year Neurologic: Grossly intact, no focal deficits, moving all 4 extremities. Psychiatric: Normal mood and affect.   Assessment & Plan:    1. Benign prostatic hyperplasia with LUTS Mild LUTS stable on tamsulosin; he did not need a refill Bladder scan PVR 20 mL Continue annual follow-up   Abbie Sons, MD  [facility] [address], [place], [place] [address] [phone]

## 2021-07-31 ENCOUNTER — Other Ambulatory Visit: Payer: Self-pay | Admitting: *Deleted

## 2021-07-31 DIAGNOSIS — N401 Enlarged prostate with lower urinary tract symptoms: Secondary | ICD-10-CM

## 2021-07-31 MED ORDER — TAMSULOSIN HCL 0.4 MG PO CAPS: 0.4000 mg | ORAL_CAPSULE | Freq: Every day | ORAL | 3 refills | Status: DC

## 2022-06-09 ENCOUNTER — Ambulatory Visit: Payer: TRICARE For Life (TFL) | Admitting: Urology

## 2022-06-16 ENCOUNTER — Ambulatory Visit (INDEPENDENT_AMBULATORY_CARE_PROVIDER_SITE_OTHER): Payer: Medicare PPO | Admitting: Urology

## 2022-06-16 ENCOUNTER — Encounter: Payer: Self-pay | Admitting: Urology

## 2022-06-16 VITALS — BP 146/75 | HR 78 | Ht 71.0 in | Wt 245.0 lb

## 2022-06-16 DIAGNOSIS — N401 Enlarged prostate with lower urinary tract symptoms: Secondary | ICD-10-CM | POA: Diagnosis not present

## 2022-06-16 LAB — BLADDER SCAN AMB NON-IMAGING: Scan Result: 65

## 2022-06-16 NOTE — Progress Notes (Signed)
06/16/2022 1:16 PM   [place] 1951-05-26 [id number]  Referring provider: Derinda Late, MD [phone] [facility] [place],  [place] [address]  Chief Complaint  Patient presents with   Benign Prostatic Hypertrophy    Urologic history: 1.  Benign prostate nodule             -Previous negative biopsy Dr. Yves Dill   2.  BPH with lower urinary tract symptoms             -On tamsulosin  HPI: 71 y.o. male presents for annual follow-up.  Doing well since last visit No bothersome LUTS Remains on tamsulosin Denies dysuria, gross hematuria Denies flank, abdominal or pelvic pain PSA November 2022 stable 1.86 IPSS today 5/35     PMH: Past Medical History:  Diagnosis Date   Allergic rhinitis    Arthritis    Cancer ([place])    basal cell skin ca   Depressive disorder    GERD (gastroesophageal reflux disease)    Hyperlipidemia    Hypertension     Surgical History: Past Surgical History:  Procedure Laterality Date   COLONOSCOPY WITH PROPOFOL N/A 12/03/2016   Procedure: COLONOSCOPY WITH PROPOFOL;  Surgeon: Manya Silvas, MD;  Location: [place];  Service: Endoscopy;  Laterality: N/A;   HERNIA REPAIR     SHOULDER ARTHROSCOPY WITH OPEN ROTATOR CUFF REPAIR Left 03/08/2019   Procedure: SHOULDER ARTHROSCOPY WITH OPEN ROTATOR CUFF REPAIR - LEFT - SUBSCAPULARIS MINI OPEN ROTATER CUFF REPAIR, SUERIOR RECONSTRUCTION DISTAL CLAVICLE EXCISION BICEPS TENODESIS;  Surgeon: Leim Fabry, MD;  Location: [facility];  Service: Orthopedics;  Laterality: Left;    Home Medications:  Allergies as of 06/16/2022       Reactions   Niacin And Related Other (See Comments)   Flushing    Penicillin G Other (See Comments)   Did it involve swelling of the face/tongue/throat, SOB, or low BP? No Did it involve sudden or severe rash/hives, skin peeling, or any reaction on the inside of your mouth or nose? Yes Did you need to seek medical attention  at a hospital or doctor's office? Yes When did it last happen?     childhood  If all above answers are "NO", may proceed with cephalosporin use.        Medication List        Accurate as of June 16, 2022  1:16 PM. If you have any questions, ask your nurse or doctor.          cetirizine 10 MG tablet Commonly known as: ZYRTEC Take 10 mg by mouth every morning.   diphenhydrAMINE 25 mg capsule Commonly known as: BENADRYL Take 25 mg by mouth every morning.   esomeprazole 40 MG capsule Commonly known as: NEXIUM Take 40 mg by mouth daily before breakfast.   Fish Oil 600 MG Caps Take 1 tablet by mouth 2 (two) times daily.   Garlic 629 MG Tabs Take 100 mg by mouth 2 (two) times a day.   GLUCOSAMINE CHOND MSM FORMULA PO Take 1 tablet by mouth 2 (two) times a day.   hydrochlorothiazide 25 MG tablet Commonly known as: HYDRODIURIL   lisinopril 20 MG tablet Commonly known as: ZESTRIL Take 20 mg by mouth every morning.   lisinopril-hydrochlorothiazide 20-25 MG tablet Commonly known as: ZESTORETIC Take 1 tablet by mouth daily.   losartan 100 MG tablet Commonly known as: COZAAR   multivitamin tablet Take 1 tablet by mouth daily.  naftifine 1 % cream Commonly known as: NAFTIN Apply 1 application topically daily as needed (irritation).   PARoxetine 20 MG tablet Commonly known as: PAXIL Take 20 mg by mouth every morning.   simvastatin 20 MG tablet Commonly known as: ZOCOR Take 20 mg by mouth daily at 6 PM.   tamsulosin 0.4 MG Caps capsule Commonly known as: FLOMAX Take 1 capsule (0.4 mg total) by mouth daily.        Allergies:  Allergies  Allergen Reactions   Niacin And Related Other (See Comments)    Flushing    Penicillin G Other (See Comments)    Did it involve swelling of the face/tongue/throat, SOB, or low BP? No Did it involve sudden or severe rash/hives, skin peeling, or any reaction on the inside of your mouth or nose? Yes Did you need to  seek medical attention at a hospital or doctor's office? Yes When did it last happen?     childhood  If all above answers are "NO", may proceed with cephalosporin use.     Family History: Family History  Problem Relation Age of Onset   Prostate cancer Neg Hx    Kidney cancer Neg Hx    Bladder Cancer Neg Hx     Social History:  reports that he has never smoked. He has never used smokeless tobacco. He reports current alcohol use. He reports that he does not use drugs.   Physical Exam: BP (!) 146/75   Pulse 78   Ht '5\' 11"'$  (1.803 m)   Wt 245 lb (111.1 kg)   BMI 34.17 kg/m   Constitutional:  Alert and oriented, No acute distress. HEENT: [place] AT, moist mucus membranes.  Trachea midline, no masses. Cardiovascular: No clubbing, cyanosis, or edema. Respiratory: Normal respiratory effort, no increased work of breathing. GU: DRE every other year Neurologic: Grossly intact, no focal deficits, moving all 4 extremities. Psychiatric: Normal mood and affect.   Assessment & Plan:    1. Benign prostatic hyperplasia with LUTS Mild LUTS stable on tamsulosin; he did not need a refill Bladder scan PVR 65 mL Continue annual follow-up   Abbie Sons, MD  [facility] [address] [place], [place] [address] [phone]

## 2022-08-10 ENCOUNTER — Telehealth: Payer: Self-pay | Admitting: Urology

## 2022-08-10 ENCOUNTER — Other Ambulatory Visit: Payer: Self-pay | Admitting: *Deleted

## 2022-08-10 DIAGNOSIS — N401 Enlarged prostate with lower urinary tract symptoms: Secondary | ICD-10-CM

## 2022-08-10 MED ORDER — TAMSULOSIN HCL 0.4 MG PO CAPS: 0.4000 mg | ORAL_CAPSULE | Freq: Every day | ORAL | 3 refills | Status: DC

## 2022-08-10 NOTE — Telephone Encounter (Signed)
Pt needs a refill for Tamsulosin sent to [facility] (He formerly used to be at [facility]) and doesn't have any more refills.  He saw [facility] 9/23.

## 2022-08-10 NOTE — Telephone Encounter (Signed)
Medication sent.

## 2022-09-19 NOTE — H&P (Signed)
Pre-Procedure H&P   Patient ID: Marc Caldwell is a 71 y.o. male.  Gastroenterology Provider: Annamaria Helling, DO  PCP: Derinda Late, MD  Date: 09/20/2022  HPI Marc Caldwell is a 71 y.o. male who presents today for Esophagogastroduodenoscopy and Colonoscopy for GERD, dysphagia; surveillance-personal history colon polyps.  Patient with ongoing regurgitation 2-3 times a week.  This did improve with increase PPI to twice a day.  Since then he has had no further issues with dysphagia or odynophagia.  Moving his bowels regularly without melena or hematochezia. Last colonoscopy performed in 2018 only noting internal hemorrhoids.  In 2015 he was noted to have 5 adenomatous polyps.  Previous colonoscopy was in 2000 72,003 were also negative.  EGD in 2007 negative for Barrett's esophagus on biopsy Most recent lab work creatinine 1.1 A1c 6.2 hemoglobin 15 MCV 87 platelets 137,000   Past Medical History:  Diagnosis Date   Allergic rhinitis    Arthritis    Cancer ([place])    basal cell skin ca   Depressive disorder    GERD (gastroesophageal reflux disease)    Hyperlipidemia    Hypertension     Past Surgical History:  Procedure Laterality Date   COLONOSCOPY WITH PROPOFOL N/A 12/03/2016   Procedure: COLONOSCOPY WITH PROPOFOL;  Surgeon: Manya Silvas, MD;  Location: [place];  Service: Endoscopy;  Laterality: N/A;   HERNIA REPAIR     SHOULDER ARTHROSCOPY WITH OPEN ROTATOR CUFF REPAIR Left 03/08/2019   Procedure: SHOULDER ARTHROSCOPY WITH OPEN ROTATOR CUFF REPAIR - LEFT - SUBSCAPULARIS MINI OPEN ROTATER CUFF REPAIR, SUERIOR RECONSTRUCTION DISTAL CLAVICLE EXCISION BICEPS TENODESIS;  Surgeon: Leim Fabry, MD;  Location: [facility];  Service: Orthopedics;  Laterality: Left;    Family History No h/o GI disease or malignancy  Review of Systems  Constitutional:  Negative for activity change, appetite change, chills, diaphoresis, fatigue, fever and unexpected  weight change.  HENT:  Negative for trouble swallowing and voice change.   Respiratory:  Negative for shortness of breath and wheezing.   Cardiovascular:  Negative for chest pain, palpitations and leg swelling.  Gastrointestinal:  Negative for abdominal distention, abdominal pain, anal bleeding, blood in stool, constipation, diarrhea, nausea and vomiting.  Musculoskeletal:  Negative for arthralgias and myalgias.  Skin:  Negative for color change and pallor.  Neurological:  Negative for dizziness, syncope and weakness.  Psychiatric/Behavioral:  Negative for confusion. The patient is not nervous/anxious.   All other systems reviewed and are negative.    Medications No current facility-administered medications on file prior to encounter.   Current Outpatient Medications on File Prior to Encounter  Medication Sig Dispense Refill   lisinopril (ZESTRIL) 20 MG tablet Take 20 mg by mouth every morning.     losartan (COZAAR) 100 MG tablet      PARoxetine (PAXIL) 20 MG tablet Take 20 mg by mouth every morning.      cetirizine (ZYRTEC) 10 MG tablet Take 10 mg by mouth every morning.     diphenhydrAMINE (BENADRYL) 25 mg capsule Take 25 mg by mouth every morning.     esomeprazole (NEXIUM) 40 MG capsule Take 40 mg by mouth daily before breakfast.      Garlic 650 MG TABS Take 100 mg by mouth 2 (two) times a day.     hydrochlorothiazide (HYDRODIURIL) 25 MG tablet      lisinopril-hydrochlorothiazide (ZESTORETIC) 20-25 MG tablet Take 1 tablet by mouth daily.     Misc Natural Products (GLUCOSAMINE CHOND MSM FORMULA  PO) Take 1 tablet by mouth 2 (two) times a day.     Multiple Vitamin (MULTIVITAMIN) tablet Take 1 tablet by mouth daily.     naftifine (NAFTIN) 1 % cream Apply 1 application topically daily as needed (irritation).      Omega-3 Fatty Acids (FISH OIL) 600 MG CAPS Take 1 tablet by mouth 2 (two) times daily.     simvastatin (ZOCOR) 20 MG tablet Take 20 mg by mouth daily at 6 PM.       Pertinent  medications related to GI and procedure were reviewed by me with the patient prior to the procedure   Current Facility-Administered Medications:    0.9 %  sodium chloride infusion, , Intravenous, Continuous, Annamaria Helling, DO, Last Rate: 20 mL/hr at 09/20/22 0740, Continued from Pre-op at 09/20/22 0740      Allergies  Allergen Reactions   Niacin And Related Other (See Comments)    Flushing    Penicillin G Other (See Comments)    Did it involve swelling of the face/tongue/throat, SOB, or low BP? No Did it involve sudden or severe rash/hives, skin peeling, or any reaction on the inside of your mouth or nose? Yes Did you need to seek medical attention at a hospital or doctor's office? Yes When did it last happen?     childhood  If all above answers are "NO", may proceed with cephalosporin use.    Allergies were reviewed by me prior to the procedure  Objective   Body mass index is 33.89 kg/m. Vitals:   09/20/22 0714  BP: (!) 137/92  Pulse: 80  Resp: 18  Temp: (!) 97.1 F (36.2 C)  TempSrc: Temporal  SpO2: 95%  Weight: 110.2 kg  Height: '5\' 11"'$  (1.803 m)     Physical Exam Vitals and nursing note reviewed.  Constitutional:      General: He is not in acute distress.    Appearance: Normal appearance. He is not ill-appearing, toxic-appearing or diaphoretic.  HENT:     Head: Normocephalic and atraumatic.     Nose: Nose normal.     Mouth/Throat:     Mouth: Mucous membranes are moist.     Pharynx: Oropharynx is clear.  Eyes:     General: No scleral icterus.    Extraocular Movements: Extraocular movements intact.  Cardiovascular:     Rate and Rhythm: Normal rate and regular rhythm.     Heart sounds: Normal heart sounds. No murmur heard.    No friction rub. No gallop.  Pulmonary:     Effort: Pulmonary effort is normal. No respiratory distress.     Breath sounds: Normal breath sounds. No wheezing, rhonchi or rales.  Abdominal:     General: Bowel sounds are  normal. There is no distension.     Palpations: Abdomen is soft.     Tenderness: There is no abdominal tenderness. There is no guarding or rebound.  Musculoskeletal:     Cervical back: Neck supple.     Right lower leg: No edema.     Left lower leg: No edema.  Skin:    General: Skin is warm and dry.     Coloration: Skin is not jaundiced or pale.  Neurological:     General: No focal deficit present.     Mental Status: He is alert and oriented to person, place, and time. Mental status is at baseline.  Psychiatric:        Mood and Affect: Mood normal.  Behavior: Behavior normal.        Thought Content: Thought content normal.        Judgment: Judgment normal.      Assessment:  Marc Caldwell is a 71 y.o. male  who presents today for Esophagogastroduodenoscopy and Colonoscopy for GERD, dysphagia; surveillance-personal history colon polyps .  Plan:  Esophagogastroduodenoscopy and Colonoscopy with possible intervention today  Esophagogastroduodenoscopy and Colonoscopy with possible biopsy, control of bleeding, polypectomy, and interventions as necessary has been discussed with the patient/patient representative. Informed consent was obtained from the patient/patient representative after explaining the indication, nature, and risks of the procedure including but not limited to death, bleeding, perforation, missed neoplasm/lesions, cardiorespiratory compromise, and reaction to medications. Opportunity for questions was given and appropriate answers were provided. Patient/patient representative has verbalized understanding is amenable to undergoing the procedure.   Annamaria Helling, DO  [facility]  Portions of the record may have been created with voice recognition software. Occasional wrong-word or 'sound-a-like' substitutions may have occurred due to the inherent limitations of voice recognition software.  Read the chart carefully and recognize,  using context, where substitutions may have occurred.

## 2022-09-20 ENCOUNTER — Encounter
Admission: RE | Disposition: A | Discharge status: Home / Self Care | Payer: Self-pay | Source: Home / Self Care | Attending: Gastroenterology

## 2022-09-20 ENCOUNTER — Ambulatory Visit: Payer: Medicare PPO | Admitting: Certified Registered Nurse Anesthetist

## 2022-09-20 ENCOUNTER — Encounter: Payer: Self-pay | Admitting: Gastroenterology

## 2022-09-20 ENCOUNTER — Ambulatory Visit
Admission: RE | Admit: 2022-09-20 | Discharge: 2022-09-20 | Disposition: A | Discharge status: Home / Self Care | Payer: Medicare PPO | Attending: Gastroenterology | Admitting: Gastroenterology

## 2022-09-20 DIAGNOSIS — K219 Gastro-esophageal reflux disease without esophagitis: Secondary | ICD-10-CM | POA: Insufficient documentation

## 2022-09-20 DIAGNOSIS — F32A Depression, unspecified: Secondary | ICD-10-CM | POA: Insufficient documentation

## 2022-09-20 DIAGNOSIS — D124 Benign neoplasm of descending colon: Secondary | ICD-10-CM | POA: Insufficient documentation

## 2022-09-20 DIAGNOSIS — E785 Hyperlipidemia, unspecified: Secondary | ICD-10-CM | POA: Insufficient documentation

## 2022-09-20 DIAGNOSIS — Z1211 Encounter for screening for malignant neoplasm of colon: Secondary | ICD-10-CM | POA: Insufficient documentation

## 2022-09-20 DIAGNOSIS — Z86010 Personal history of colonic polyps: Secondary | ICD-10-CM | POA: Diagnosis not present

## 2022-09-20 DIAGNOSIS — K317 Polyp of stomach and duodenum: Secondary | ICD-10-CM | POA: Diagnosis not present

## 2022-09-20 DIAGNOSIS — K2970 Gastritis, unspecified, without bleeding: Secondary | ICD-10-CM | POA: Insufficient documentation

## 2022-09-20 DIAGNOSIS — I10 Essential (primary) hypertension: Secondary | ICD-10-CM | POA: Diagnosis not present

## 2022-09-20 DIAGNOSIS — Z79899 Other long term (current) drug therapy: Secondary | ICD-10-CM | POA: Insufficient documentation

## 2022-09-20 DIAGNOSIS — D123 Benign neoplasm of transverse colon: Secondary | ICD-10-CM | POA: Insufficient documentation

## 2022-09-20 DIAGNOSIS — K3189 Other diseases of stomach and duodenum: Secondary | ICD-10-CM | POA: Insufficient documentation

## 2022-09-20 DIAGNOSIS — D120 Benign neoplasm of cecum: Secondary | ICD-10-CM | POA: Insufficient documentation

## 2022-09-20 DIAGNOSIS — K640 First degree hemorrhoids: Secondary | ICD-10-CM | POA: Insufficient documentation

## 2022-09-20 DIAGNOSIS — R1310 Dysphagia, unspecified: Secondary | ICD-10-CM | POA: Insufficient documentation

## 2022-09-20 HISTORY — PX: ESOPHAGOGASTRODUODENOSCOPY: SHX5428

## 2022-09-20 HISTORY — PX: COLONOSCOPY: SHX5424

## 2022-09-20 SURGERY — COLONOSCOPY
Anesthesia: General

## 2022-09-20 MED ORDER — PHENYLEPHRINE 80 MCG/ML (10ML) SYRINGE FOR IV PUSH (FOR BLOOD PRESSURE SUPPORT)
PREFILLED_SYRINGE | INTRAVENOUS | Status: DC | PRN
  Administered 2022-09-20: 80 ug via INTRAVENOUS
  Administered 2022-09-20 (×2): 160 ug via INTRAVENOUS
  Administered 2022-09-20: 80 ug via INTRAVENOUS
  Administered 2022-09-20: 160 ug via INTRAVENOUS
  Administered 2022-09-20: 80 ug via INTRAVENOUS

## 2022-09-20 MED ORDER — GLYCOPYRROLATE 0.2 MG/ML IJ SOLN
INTRAMUSCULAR | Status: DC | PRN
  Administered 2022-09-20: .2 mg via INTRAVENOUS

## 2022-09-20 MED ORDER — PROPOFOL 1000 MG/100ML IV EMUL
INTRAVENOUS | Status: AC
  Filled 2022-09-20: qty 100

## 2022-09-20 MED ORDER — LIDOCAINE HCL (CARDIAC) PF 100 MG/5ML IV SOSY
PREFILLED_SYRINGE | INTRAVENOUS | Status: DC | PRN
  Administered 2022-09-20: 100 mg via INTRAVENOUS

## 2022-09-20 MED ORDER — SODIUM CHLORIDE 0.9 % IV SOLN: INTRAVENOUS | Status: DC

## 2022-09-20 MED ORDER — PHENYLEPHRINE 80 MCG/ML (10ML) SYRINGE FOR IV PUSH (FOR BLOOD PRESSURE SUPPORT)
PREFILLED_SYRINGE | INTRAVENOUS | Status: AC
  Filled 2022-09-20: qty 20

## 2022-09-20 MED ORDER — PROPOFOL 10 MG/ML IV BOLUS
INTRAVENOUS | Status: DC | PRN
  Administered 2022-09-20: 70 mg via INTRAVENOUS
  Administered 2022-09-20: 30 mg via INTRAVENOUS

## 2022-09-20 MED ORDER — EPHEDRINE 5 MG/ML INJ
INTRAVENOUS | Status: AC
  Filled 2022-09-20: qty 10

## 2022-09-20 MED ORDER — EPHEDRINE SULFATE (PRESSORS) 50 MG/ML IJ SOLN
INTRAMUSCULAR | Status: DC | PRN
  Administered 2022-09-20 (×4): 5 mg via INTRAVENOUS

## 2022-09-20 MED ORDER — PROPOFOL 500 MG/50ML IV EMUL
INTRAVENOUS | Status: DC | PRN
  Administered 2022-09-20: 150 ug/kg/min via INTRAVENOUS

## 2022-09-20 NOTE — Transfer of Care (Signed)
Immediate Anesthesia Transfer of Care Note  Patient: Marc Caldwell [facility]  Procedure(s) Performed: COLONOSCOPY ESOPHAGOGASTRODUODENOSCOPY (EGD)  Patient Location: PACU  Anesthesia Type:General  Level of Consciousness: drowsy  Airway & Oxygen Therapy: Patient Spontanous Breathing  Post-op Assessment: Report given to RN and Post -op Vital signs reviewed and stable  Post vital signs: Reviewed and stable  Last Vitals:  Vitals Value Taken Time  BP 83/62 09/20/22 0842  Temp 36.4 C 09/20/22 0839  Pulse 55 09/20/22 0842  Resp 8 09/20/22 0842  SpO2 92 % 09/20/22 0842  Vitals shown include unvalidated device data.  Last Pain:  Vitals:   09/20/22 0839  TempSrc: Temporal  PainSc: Asleep         Complications: No notable events documented.

## 2022-09-20 NOTE — Op Note (Signed)
[facility] Patient Name: Marc Caldwell Procedure Date: 09/20/2022 7:19 AM MRN: [id number] Account #: [id number] Date of Birth: May 31, 1951 Admit Type: Outpatient Age: 71 Room: [facility] ENDO ROOM 2 Gender: Male Note Status: Finalized Instrument Name: Colonoscope [id number] Procedure:             Colonoscopy Indications:           High risk colon cancer surveillance: Personal history                         of colonic polyps Providers:             Annamaria Helling DO, DO Medicines:             Monitored Anesthesia Care Complications:         No immediate complications. Estimated blood loss:                         Minimal. Procedure:             Pre-Anesthesia Assessment:                        - Prior to the procedure, a History and Physical was                         performed, and patient medications and allergies were                         reviewed. The patient is competent. The risks and                         benefits of the procedure and the sedation options and                         risks were discussed with the patient. All questions                         were answered and informed consent was obtained.                         Patient identification and proposed procedure were                         verified by the physician, the nurse, the anesthetist                         and the technician in the endoscopy suite. Mental                         Status Examination: alert and oriented. Airway                         Examination: normal oropharyngeal airway and neck                         mobility. Respiratory Examination: clear to                         auscultation. CV Examination: RRR, no murmurs, no S3  or S4. Prophylactic Antibiotics: The patient does not                         require prophylactic antibiotics. Prior                         Anticoagulants: The patient has taken no anticoagulant                          or antiplatelet agents. ASA Grade Assessment: II - A                         patient with mild systemic disease. After reviewing                         the risks and benefits, the patient was deemed in                         satisfactory condition to undergo the procedure. The                         anesthesia plan was to use monitored anesthesia care                         (MAC). Immediately prior to administration of                         medications, the patient was re-assessed for adequacy                         to receive sedatives. The heart rate, respiratory                         rate, oxygen saturations, blood pressure, adequacy of                         pulmonary ventilation, and response to care were                         monitored throughout the procedure. The physical                         status of the patient was re-assessed after the                         procedure.                        After obtaining informed consent, the colonoscope was                         passed under direct vision. Throughout the procedure,                         the patient's blood pressure, pulse, and oxygen                         saturations were monitored continuously. The  Colonoscope was introduced through the anus and                         advanced to the the cecum, identified by appendiceal                         orifice and ileocecal valve. The colonoscopy was                         performed without difficulty. The patient tolerated                         the procedure well. The quality of the bowel                         preparation was evaluated using the BBPS [facility] Bowel                         Preparation Scale) with scores of: Right Colon = 2                         (minor amount of residual staining, small fragments of                         stool and/or opaque liquid, but mucosa seen well),                          Transverse Colon = 3 (entire mucosa seen well with no                         residual staining, small fragments of stool or opaque                         liquid) and Left Colon = 3 (entire mucosa seen well                         with no residual staining, small fragments of stool or                         opaque liquid). The total BBPS score equals 8. The                         quality of the bowel preparation was excellent. The                         ileocecal valve, appendiceal orifice, and rectum were                         photographed. Findings:      The perianal and digital rectal examinations were normal. Pertinent       negatives include normal sphincter tone.      Non-bleeding internal hemorrhoids were found during retroflexion. The       hemorrhoids were Grade I (internal hemorrhoids that do not prolapse).       Estimated blood loss: none.      A 5 to 7 mm polyp was found in the transverse colon. The polyp  was       sessile. The polyp was removed with a cold snare. Resection and       retrieval were complete. Estimated blood loss was minimal.      Six sessile polyps were found in the descending colon (2), transverse       colon (2) and cecum (2). The polyps were 1 to 2 mm in size. These polyps       were removed with a jumbo cold forceps. Resection and retrieval were       complete. Estimated blood loss was minimal.      The exam was otherwise without abnormality on direct and retroflexion       views. Impression:            - Non-bleeding internal hemorrhoids.                        - One 5 to 7 mm polyp in the transverse colon, removed                         with a cold snare. Resected and retrieved.                        - Six 1 to 2 mm polyps in the descending colon, in the                         transverse colon and in the cecum, removed with a                         jumbo cold forceps. Resected and retrieved.                        - The examination was  otherwise normal on direct and                         retroflexion views. Recommendation:        - Patient has a contact number available for                         emergencies. The signs and symptoms of potential                         delayed complications were discussed with the patient.                         Return to normal activities tomorrow. Written                         discharge instructions were provided to the patient.                        - Discharge patient to home.                        - Resume previous diet.                        - Continue present medications.                        -  Await pathology results.                        - Repeat colonoscopy for surveillance based on                         pathology results.                        - Return to referring physician as previously                         scheduled.                        - The findings and recommendations were discussed with                         the patient. Procedure Code(s):     --- Professional ---                        [phone], Colonoscopy, flexible; with removal of                         tumor(s), polyp(s), or other lesion(s) by snare                         technique                        45380, 17, Colonoscopy, flexible; with biopsy, single                         or multiple Diagnosis Code(s):     --- Professional ---                        Z86.010, Personal history of colonic polyps                        D12.3, Benign neoplasm of transverse colon (hepatic                         flexure or splenic flexure)                        D12.4, Benign neoplasm of descending colon                        D12.0, Benign neoplasm of cecum                        K64.0, First degree hemorrhoids CPT copyright 2022 American Medical Association. All rights reserved. The codes documented in this report are preliminary and upon coder review may  be revised to meet current compliance  requirements. Attending Participation:      I personally performed the entire procedure. Volney American, DO Annamaria Helling DO, DO 09/20/2022 8:40:00 AM This report has been signed electronically. Number of Addenda: 0 Note Initiated On: 09/20/2022 7:19 AM Scope Withdrawal Time: 0 hours 25 minutes 38 seconds  Total Procedure Duration: 0 hours 31 minutes 5 seconds  Estimated Blood Loss:  Estimated blood loss was  minimal.      [facility]

## 2022-09-20 NOTE — Interval H&P Note (Signed)
History and Physical Interval Note: Preprocedure H&P from 09/20/22  was reviewed and there was no interval change after seeing and examining the patient.  Written consent was obtained from the patient after discussion of risks, benefits, and alternatives. Patient has consented to proceed with Esophagogastroduodenoscopy and Colonoscopy with possible intervention   09/20/2022 7:44 AM  Marc Caldwell [facility]  has presented today for surgery, with the diagnosis of Gastroesophageal reflux disease, unspecified whether esophagitis present (K21.9) Personal history of colonic polyps (Z86.010).  The various methods of treatment have been discussed with the patient and family. After consideration of risks, benefits and other options for treatment, the patient has consented to  Procedure(s): COLONOSCOPY (N/A) ESOPHAGOGASTRODUODENOSCOPY (EGD) (N/A) as a surgical intervention.  The patient's history has been reviewed, patient examined, no change in status, stable for surgery.  I have reviewed the patient's chart and labs.  Questions were answered to the patient's satisfaction.     Annamaria Helling

## 2022-09-20 NOTE — Op Note (Signed)
[facility] Gastroenterology Patient Name: Marc Caldwell Procedure Date: 09/20/2022 7:20 AM MRN: [id number] Account #: [id number] Date of Birth: 11/06/1950 Admit Type: Outpatient Age: 71 Room: [facility] ENDO ROOM 2 Gender: Male Note Status: Finalized Instrument Name: Upper Endoscope [id number] Procedure:             Upper GI endoscopy Indications:           Dysphagia, Heartburn Providers:             Annamaria Helling DO, DO Medicines:             Monitored Anesthesia Care Complications:         No immediate complications. Estimated blood loss:                         Minimal. Procedure:             Pre-Anesthesia Assessment:                        - Prior to the procedure, a History and Physical was                         performed, and patient medications and allergies were                         reviewed. The patient is competent. The risks and                         benefits of the procedure and the sedation options and                         risks were discussed with the patient. All questions                         were answered and informed consent was obtained.                         Patient identification and proposed procedure were                         verified by the physician, the nurse, the anesthetist                         and the technician in the endoscopy suite. Mental                         Status Examination: alert and oriented. Airway                         Examination: normal oropharyngeal airway and neck                         mobility. Respiratory Examination: clear to                         auscultation. CV Examination: RRR, no murmurs, no S3                         or S4. Prophylactic Antibiotics: The patient does not  require prophylactic antibiotics. Prior                         Anticoagulants: The patient has taken no anticoagulant                         or antiplatelet agents. ASA Grade Assessment:  II - A                         patient with mild systemic disease. After reviewing                         the risks and benefits, the patient was deemed in                         satisfactory condition to undergo the procedure. The                         anesthesia plan was to use monitored anesthesia care                         (MAC). Immediately prior to administration of                         medications, the patient was re-assessed for adequacy                         to receive sedatives. The heart rate, respiratory                         rate, oxygen saturations, blood pressure, adequacy of                         pulmonary ventilation, and response to care were                         monitored throughout the procedure. The physical                         status of the patient was re-assessed after the                         procedure.                        After obtaining informed consent, the endoscope was                         passed under direct vision. Throughout the procedure,                         the patient's blood pressure, pulse, and oxygen                         saturations were monitored continuously. The Endoscope                         was introduced through the mouth, and advanced to the  second part of duodenum. The upper GI endoscopy was                         accomplished without difficulty. The patient tolerated                         the procedure well. Findings:      The duodenal bulb, first portion of the duodenum and second portion of       the duodenum were normal. Estimated blood loss: none.      Localized mild inflammation characterized by erythema was found in the       gastric antrum. Biopsies were taken with a cold forceps for Helicobacter       pylori testing. Estimated blood loss was minimal.      A few less than 5 mm sessile polyps with no bleeding and no stigmata of       recent bleeding were found in the  gastric fundus and on the greater       curvature of the stomach. fundic gland appearing polyps. Estimated blood       loss: none.      The exam of the stomach was otherwise normal.      The Z-line was regular. Estimated blood loss: none.      Esophagogastric landmarks were identified: the gastroesophageal junction       was found at 40 cm from the incisors.      The examined esophagus was normal. Estimated blood loss: none.      No endoscopic abnormality was evident in the esophagus to explain the       patient's complaint of dysphagia. Estimated blood loss: none. Impression:            - Normal duodenal bulb, first portion of the duodenum                         and second portion of the duodenum.                        - Gastritis. Biopsied.                        - A few gastric polyps.                        - Z-line regular.                        - Esophagogastric landmarks identified.                        - Normal esophagus.                        - No endoscopic esophageal abnormality to explain                         patient's dysphagia. Recommendation:        - Patient has a contact number available for                         emergencies. The signs and symptoms of potential  delayed complications were discussed with the patient.                         Return to normal activities tomorrow. Written                         discharge instructions were provided to the patient.                        - Discharge patient to home.                        - Resume previous diet.                        - Continue present medications.                        - Await pathology results.                        - Return to referring physician as previously                         scheduled.                        - proceed with colonoscopy                        - The findings and recommendations were discussed with                         the patient. Procedure  Code(s):     --- Professional ---                        [phone], Esophagogastroduodenoscopy, flexible,                         transoral; with biopsy, single or multiple Diagnosis Code(s):     --- Professional ---                        K29.70, Gastritis, unspecified, without bleeding                        K31.7, Polyp of stomach and duodenum                        R13.10, Dysphagia, unspecified                        R12, Heartburn CPT copyright 2022 American [employer] Association. All rights reserved. The codes documented in this report are preliminary and upon coder review may  be revised to meet current compliance requirements. Attending Participation:      I personally performed the entire procedure. Volney American, DO Annamaria Helling DO, DO 09/20/2022 8:01:37 AM This report has been signed electronically. Number of Addenda: 0 Note Initiated On: 09/20/2022 7:20 AM Estimated Blood Loss:  Estimated blood loss was minimal.      [facility]

## 2022-09-20 NOTE — Anesthesia Postprocedure Evaluation (Signed)
Anesthesia Post Note  Patient: Marc Caldwell [facility]  Procedure(s) Performed: COLONOSCOPY ESOPHAGOGASTRODUODENOSCOPY (EGD)  Patient location during evaluation: Endoscopy Anesthesia Type: General Level of consciousness: awake and alert Pain management: pain level controlled Vital Signs Assessment: post-procedure vital signs reviewed and stable Respiratory status: spontaneous breathing, nonlabored ventilation, respiratory function stable and patient connected to nasal cannula oxygen Cardiovascular status: blood pressure returned to baseline and stable Postop Assessment: no apparent nausea or vomiting Anesthetic complications: no   No notable events documented.   Last Vitals:  Vitals:   09/20/22 0849 09/20/22 0859  BP: (!) 99/57 120/69  Pulse: 92 85  Resp: 12 10  Temp:    SpO2: 95% 95%    Last Pain:  Vitals:   09/20/22 0859  TempSrc:   PainSc: 0-No pain                 Martha Clan

## 2022-09-20 NOTE — Anesthesia Preprocedure Evaluation (Signed)
Anesthesia Evaluation  Patient identified by MRN, date of birth, ID band Patient awake    Reviewed: Allergy & Precautions, H&P , NPO status , Patient's Chart, lab work & pertinent test results  History of Anesthesia Complications Negative for: history of anesthetic complications  Airway Mallampati: III  TM Distance: <3 FB     Dental  (+) Chipped   Pulmonary neg pulmonary ROS, neg shortness of breath, neg COPD, neg recent URI, Not current smoker          Cardiovascular hypertension, (-) angina (-) Cardiac Stents negative cardio ROS (-) dysrhythmias      Neuro/Psych  PSYCHIATRIC DISORDERS  Depression    negative neurological ROS     GI/Hepatic Neg liver ROS,GERD  Controlled,,  Endo/Other  negative endocrine ROS    Renal/GU      Musculoskeletal   Abdominal   Peds  Hematology negative hematology ROS (+)   Anesthesia Other Findings Past Medical History: No date: Allergic rhinitis No date: Arthritis No date: Cancer ([place])     Comment:  basal cell skin ca No date: Depressive disorder No date: GERD (gastroesophageal reflux disease) No date: Hyperlipidemia No date: Hypertension  Past Surgical History: 12/03/2016: COLONOSCOPY WITH PROPOFOL; N/A     Comment:  Procedure: COLONOSCOPY WITH PROPOFOL;  Surgeon: Manya Silvas, MD;  Location: [facility] END[facility]COPY;  Service:               Endoscopy;  Laterality: N/A; No date: HERNIA REPAIR     Reproductive/Obstetrics negative OB ROS                             Anesthesia Physical Anesthesia Plan  ASA: 2  Anesthesia Plan: General   Post-op Pain Management:    Induction: Intravenous  PONV Risk Score and Plan: Propofol infusion and TIVA  Airway Management Planned: Natural Airway and Nasal Cannula  Additional Equipment:   Intra-op Plan:   Post-operative Plan:   Informed Consent: I have reviewed the patients History and  Physical, chart, labs and discussed the procedure including the risks, benefits and alternatives for the proposed anesthesia with the patient or authorized representative who has indicated his/her understanding and acceptance.     Dental Advisory Given  Plan Discussed with: Anesthesiologist and CRNA  Anesthesia Plan Comments:         Anesthesia Quick Evaluation

## 2022-09-20 NOTE — Anesthesia Procedure Notes (Signed)
Date/Time: 09/20/2022 7:50 AM  Performed by: Lily Peer, Lj Miyamoto, CRNAPre-anesthesia Checklist: Patient identified, Emergency Drugs available, Suction available, Patient being monitored and Timeout performed Patient Re-evaluated:Patient Re-evaluated prior to induction Oxygen Delivery Method: Simple face mask Induction Type: IV induction

## 2022-09-21 ENCOUNTER — Encounter: Payer: Self-pay | Admitting: Gastroenterology

## 2022-09-21 LAB — SURGICAL PATHOLOGY

## 2023-05-03 ENCOUNTER — Other Ambulatory Visit: Payer: Self-pay | Admitting: Urology

## 2023-05-03 DIAGNOSIS — N401 Enlarged prostate with lower urinary tract symptoms: Secondary | ICD-10-CM

## 2023-06-16 ENCOUNTER — Ambulatory Visit: Payer: Medicare PPO | Admitting: Urology

## 2023-06-24 ENCOUNTER — Encounter: Payer: Self-pay | Admitting: Urology

## 2023-06-24 ENCOUNTER — Ambulatory Visit (INDEPENDENT_AMBULATORY_CARE_PROVIDER_SITE_OTHER): Payer: Medicare PPO | Admitting: Urology

## 2023-06-24 VITALS — BP 105/67 | HR 82

## 2023-06-24 DIAGNOSIS — N401 Enlarged prostate with lower urinary tract symptoms: Secondary | ICD-10-CM

## 2023-06-24 DIAGNOSIS — R1032 Left lower quadrant pain: Secondary | ICD-10-CM | POA: Diagnosis not present

## 2023-06-24 LAB — URINALYSIS, COMPLETE
Bilirubin, UA: NEGATIVE
Glucose, UA: NEGATIVE
Ketones, UA: NEGATIVE
Nitrite, UA: NEGATIVE
Protein,UA: NEGATIVE
RBC, UA: NEGATIVE
Specific Gravity, UA: 1.02 (ref 1.005–1.030)
Urobilinogen, Ur: 1 mg/dL (ref 0.2–1.0)
pH, UA: 7 (ref 5.0–7.5)

## 2023-06-24 LAB — MICROSCOPIC EXAMINATION

## 2023-06-24 LAB — BLADDER SCAN AMB NON-IMAGING: Scan Result: 0

## 2023-06-24 MED ORDER — TAMSULOSIN HCL 0.4 MG PO CAPS: 0.4000 mg | ORAL_CAPSULE | Freq: Every day | ORAL | 3 refills | Status: DC

## 2023-06-24 NOTE — Progress Notes (Signed)
I, Maysun Anabel Bene, acting as a scribe for Riki Altes, MD., have documented all relevant documentation on the behalf of Riki Altes, MD, as directed by Riki Altes, MD while in the presence of Riki Altes, MD.  06/24/2023 2:30 PM   Marzetta Merino Murvin Donning 28-Oct-1950 [id number]  Referring provider: Kandyce Rud, MD [phone] [address] [facility] [place],  [place] [address]  Chief Complaint  Patient presents with   Follow-up   Urologic history: 1.  Benign prostate nodule Previous negative biopsy Dr. Evelene Croon   2.  BPH with lower urinary tract symptoms On tamsulosin  HPI: Patton Koss is a 72 y.o. male presents for annual follow-up.  Doing well since last visit No bothersome LUTS Remains on tamsulosin Denies dysuria, gross hematuria Denies flank, abdominal or pelvic pain Has noted vague left lower quadrant pain for the last few weeks, which is improving.  PSA November 2023 stable 2.15 and IPSS today was 8/35.   PMH: Past Medical History:  Diagnosis Date   Allergic rhinitis    Arthritis    Cancer (HCC)    basal cell skin ca   Depressive disorder    GERD (gastroesophageal reflux disease)    Hyperlipidemia    Hypertension     Surgical History: Past Surgical History:  Procedure Laterality Date   COLONOSCOPY N/A 09/20/2022   Procedure: COLONOSCOPY;  Surgeon: Jaynie Collins, DO;  Location: [facility] ENDOSCOPY;  Service: Gastroenterology;  Laterality: N/A;   COLONOSCOPY WITH PROPOFOL N/A 12/03/2016   Procedure: COLONOSCOPY WITH PROPOFOL;  Surgeon: Scot Jun, MD;  Location: [facility] ENDOSCOPY;  Service: Endoscopy;  Laterality: N/A;   ESOPHAGOGASTRODUODENOSCOPY N/A 09/20/2022   Procedure: ESOPHAGOGASTRODUODENOSCOPY (EGD);  Surgeon: Jaynie Collins, DO;  Location: [facility] ENDOSCOPY;  Service: Gastroenterology;  Laterality: N/A;   HERNIA REPAIR     SHOULDER ARTHROSCOPY WITH OPEN ROTATOR CUFF REPAIR Left  03/08/2019   Procedure: SHOULDER ARTHROSCOPY WITH OPEN ROTATOR CUFF REPAIR - LEFT - SUBSCAPULARIS MINI OPEN ROTATER CUFF REPAIR, SUERIOR RECONSTRUCTION DISTAL CLAVICLE EXCISION BICEPS TENODESIS;  Surgeon: Signa Kell, MD;  Location: [facility] ORS;  Service: Orthopedics;  Laterality: Left;    Home Medications:  Allergies as of 06/24/2023       Reactions   Niacin And Related Other (See Comments)   Flushing    Penicillin G Other (See Comments)   Did it involve swelling of the face/tongue/throat, SOB, or low BP? No Did it involve sudden or severe rash/hives, skin peeling, or any reaction on the inside of your mouth or nose? Yes Did you need to seek medical attention at a hospital or doctor's office? Yes When did it last happen?     childhood  If all above answers are "NO", may proceed with cephalosporin use.        Medication List        Accurate as of June 24, 2023  2:30 PM. If you have any questions, ask your nurse or doctor.          cetirizine 10 MG tablet Commonly known as: ZYRTEC Take 10 mg by mouth every morning.   diphenhydrAMINE 25 mg capsule Commonly known as: BENADRYL Take 25 mg by mouth every morning.   esomeprazole 40 MG capsule Commonly known as: NEXIUM Take 40 mg by mouth daily before breakfast.   Fish Oil 600 MG Caps Take 1 tablet by mouth 2 (two) times daily.   Garlic 100 MG Tabs Take 100  mg by mouth 2 (two) times a day.   GLUCOSAMINE CHOND MSM FORMULA PO Take 1 tablet by mouth 2 (two) times a day.   hydrochlorothiazide 25 MG tablet Commonly known as: HYDRODIURIL   HYDROcodone-acetaminophen 5-325 MG tablet Commonly known as: NORCO/VICODIN   lisinopril 20 MG tablet Commonly known as: ZESTRIL Take 20 mg by mouth every morning.   lisinopril-hydrochlorothiazide 20-25 MG tablet Commonly known as: ZESTORETIC Take 1 tablet by mouth daily.   losartan 100 MG tablet Commonly known as: COZAAR   multivitamin tablet Take 1 tablet by mouth daily.    naftifine 1 % cream Commonly known as: NAFTIN Apply 1 application topically daily as needed (irritation).   PARoxetine 20 MG tablet Commonly known as: PAXIL Take 20 mg by mouth every morning.   predniSONE 10 MG tablet Commonly known as: DELTASONE   simvastatin 20 MG tablet Commonly known as: ZOCOR Take 20 mg by mouth daily at 6 PM.   tamsulosin 0.4 MG Caps capsule Commonly known as: FLOMAX Take 1 capsule (0.4 mg total) by mouth daily.        Allergies:  Allergies  Allergen Reactions   Niacin And Related Other (See Comments)    Flushing    Penicillin G Other (See Comments)    Did it involve swelling of the face/tongue/throat, SOB, or low BP? No Did it involve sudden or severe rash/hives, skin peeling, or any reaction on the inside of your mouth or nose? Yes Did you need to seek medical attention at a hospital or doctor's office? Yes When did it last happen?     childhood  If all above answers are "NO", may proceed with cephalosporin use.     Family History: Family History  Problem Relation Age of Onset   Prostate cancer Neg Hx    Kidney cancer Neg Hx    Bladder Cancer Neg Hx     Social History:  reports that he has never smoked. He has never used smokeless tobacco. He reports current alcohol use. He reports that he does not use drugs.   Physical Exam: BP 105/67   Pulse 82   Constitutional:  Alert and oriented, No acute distress. HEENT: [place] AT, moist mucus membranes.  Trachea midline, no masses. Cardiovascular: No clubbing, cyanosis, or edema. Respiratory: Normal respiratory effort, no increased work of breathing. GI: Abdomen is soft, nontender, nondistended, no abdominal masses Skin: No rashes, bruises or suspicious lesions. Neurologic: Grossly intact, no focal deficits, moving all 4 extremities. Psychiatric: Normal mood and affect.   Assessment & Plan:    1. Benign prostatic hyperplasia with LUTS Tamsulosin was refilled.  Bladder scan was0 mL.   Continue annual follow-up  2. Left lower quadrant abdominal pain  Mild discomfort, which is improving.  If pain persists over the next few weeks, would recommend PCP follow-up for further evaluation.  [facility] [address] [place], [place] [address] [phone]

## 2023-06-26 ENCOUNTER — Encounter: Payer: Self-pay | Admitting: Urology

## 2023-12-29 ENCOUNTER — Other Ambulatory Visit: Payer: Self-pay | Admitting: Orthopedic Surgery

## 2023-12-29 DIAGNOSIS — M25311 Other instability, right shoulder: Secondary | ICD-10-CM

## 2024-01-12 ENCOUNTER — Ambulatory Visit
Admission: RE | Admit: 2024-01-12 | Discharge: 2024-01-12 | Disposition: A | Discharge status: Home / Self Care | Source: Ambulatory Visit | Attending: Orthopedic Surgery | Admitting: Orthopedic Surgery

## 2024-01-12 DIAGNOSIS — M25311 Other instability, right shoulder: Secondary | ICD-10-CM

## 2024-06-20 ENCOUNTER — Ambulatory Visit: Admitting: Urology

## 2024-06-22 ENCOUNTER — Ambulatory Visit: Payer: Self-pay | Admitting: Urology

## 2024-07-10 ENCOUNTER — Encounter: Payer: Self-pay | Admitting: Urology

## 2024-07-10 ENCOUNTER — Ambulatory Visit (INDEPENDENT_AMBULATORY_CARE_PROVIDER_SITE_OTHER): Admitting: Urology

## 2024-07-10 VITALS — BP 135/75 | HR 80 | Ht 71.0 in | Wt 240.0 lb

## 2024-07-10 DIAGNOSIS — N401 Enlarged prostate with lower urinary tract symptoms: Secondary | ICD-10-CM | POA: Diagnosis not present

## 2024-07-10 MED ORDER — TAMSULOSIN HCL 0.4 MG PO CAPS: 0.4000 mg | ORAL_CAPSULE | Freq: Every day | ORAL | 3 refills | Status: AC

## 2024-07-10 NOTE — Progress Notes (Signed)
 07/10/2024 10:09 AM   Marc Caldwell 07/04/1951 [id number]  Referring provider: Diedra Lame, MD [phone] [address] [facility] [place],  [place] [address]  Chief Complaint  Patient presents with   Follow-up   Benign Prostatic Hypertrophy   Urologic history:  1.  Benign prostate nodule Previous negative biopsy Dr. Kassie   2.  BPH with lower urinary tract symptoms On tamsulosin   HPI: Marc Caldwell is a 73 y.o. male presents for annual follow-up.  Doing well since last visit No bothersome LUTS Remains on tamsulosin  Denies dysuria, gross hematuria Denies flank, abdominal or pelvic pain PSA 08/29/2023 was 2.79   PMH: Past Medical History:  Diagnosis Date   Allergic rhinitis    Arthritis    Cancer (HCC)    basal cell skin ca   Depressive disorder    GERD (gastroesophageal reflux disease)    Hyperlipidemia    Hypertension     Surgical History: Past Surgical History:  Procedure Laterality Date   COLONOSCOPY N/A 09/20/2022   Procedure: COLONOSCOPY;  Surgeon: Onita Elspeth Sharper, DO;  Location: [facility] ENDOSCOPY;  Service: Gastroenterology;  Laterality: N/A;   COLONOSCOPY WITH PROPOFOL  N/A 12/03/2016   Procedure: COLONOSCOPY WITH PROPOFOL ;  Surgeon: Lamar ONEIDA Holmes, MD;  Location: [facility] ENDOSCOPY;  Service: Endoscopy;  Laterality: N/A;   ESOPHAGOGASTRODUODENOSCOPY N/A 09/20/2022   Procedure: ESOPHAGOGASTRODUODENOSCOPY (EGD);  Surgeon: Onita Elspeth Sharper, DO;  Location: [facility] ENDOSCOPY;  Service: Gastroenterology;  Laterality: N/A;   HERNIA REPAIR     SHOULDER ARTHROSCOPY WITH OPEN ROTATOR CUFF REPAIR Left 03/08/2019   Procedure: SHOULDER ARTHROSCOPY WITH OPEN ROTATOR CUFF REPAIR - LEFT - SUBSCAPULARIS MINI OPEN ROTATER CUFF REPAIR, SUERIOR RECONSTRUCTION DISTAL CLAVICLE EXCISION BICEPS TENODESIS;  Surgeon: Tobie Priest, MD;  Location: [facility] ORS;  Service: Orthopedics;  Laterality: Left;    Home Medications:   Allergies as of 07/10/2024       Reactions   Niacin And Related Other (See Comments)   Flushing    Penicillin G Other (See Comments)   Did it involve swelling of the face/tongue/throat, SOB, or low BP? No Did it involve sudden or severe rash/hives, skin peeling, or any reaction on the inside of your mouth or nose? Yes Did you need to seek medical attention at a hospital or doctor's office? Yes When did it last happen?     childhood  If all above answers are "NO", may proceed with cephalosporin use.        Medication List        Accurate as of July 10, 2024 10:09 AM. If you have any questions, ask your nurse or doctor.          cetirizine 10 MG tablet Commonly known as: ZYRTEC Take 10 mg by mouth every morning.   diphenhydrAMINE 25 mg capsule Commonly known as: BENADRYL Take 25 mg by mouth every morning.   esomeprazole 40 MG capsule Commonly known as: NEXIUM Take 40 mg by mouth daily before breakfast.   Fish Oil 600 MG Caps Take 1 tablet by mouth 2 (two) times daily.   Garlic 100 MG Tabs Take 100 mg by mouth 2 (two) times a day.   GLUCOSAMINE CHOND MSM FORMULA PO Take 1 tablet by mouth 2 (two) times a day.   hydrochlorothiazide 25 MG tablet Commonly known as: HYDRODIURIL   HYDROcodone-acetaminophen  5-325 MG tablet Commonly known as: NORCO/VICODIN   lisinopril 20 MG tablet Commonly known as: ZESTRIL Take 20 mg by mouth every  morning.   lisinopril-hydrochlorothiazide 20-25 MG tablet Commonly known as: ZESTORETIC Take 1 tablet by mouth daily.   losartan 100 MG tablet Commonly known as: COZAAR   multivitamin tablet Take 1 tablet by mouth daily.   naftifine 1 % cream Commonly known as: NAFTIN Apply 1 application topically daily as needed (irritation).   PARoxetine 20 MG tablet Commonly known as: PAXIL Take 20 mg by mouth every morning.   predniSONE 10 MG tablet Commonly known as: DELTASONE   simvastatin 20 MG tablet Commonly known as:  ZOCOR Take 20 mg by mouth daily at 6 PM.   tamsulosin  0.4 MG Caps capsule Commonly known as: FLOMAX  Take 1 capsule (0.4 mg total) by mouth daily.        Allergies:  Allergies  Allergen Reactions   Niacin And Related Other (See Comments)    Flushing    Penicillin G Other (See Comments)    Did it involve swelling of the face/tongue/throat, SOB, or low BP? No Did it involve sudden or severe rash/hives, skin peeling, or any reaction on the inside of your mouth or nose? Yes Did you need to seek medical attention at a hospital or doctor's office? Yes When did it last happen?     childhood  If all above answers are "NO", may proceed with cephalosporin use.     Family History: Family History  Problem Relation Age of Onset   Prostate cancer Neg Hx    Kidney cancer Neg Hx    Bladder Cancer Neg Hx     Social History:  reports that he has never smoked. He has never used smokeless tobacco. He reports current alcohol use. He reports that he does not use drugs.   Physical Exam: BP 135/75   Pulse 80   Ht 5' 11 (1.803 m)   Wt 240 lb (108.9 kg)   BMI 33.47 kg/m   Constitutional:  Alert, No acute distress. HEENT: [place] AT Respiratory: Normal respiratory effort, no increased work of breathing. Psychiatric: Normal mood and affect.   Assessment & Plan:    1. Benign prostatic hyperplasia with lower urinary tract symptoms, symptom details unspecified  Stable Tamsulosin  refilled PVR today 0 mL 1 year follow-up with PVR    Marc JAYSON Barba, MD  [facility] [address] [place], [place] [address] [phone]

## 2024-07-11 ENCOUNTER — Other Ambulatory Visit: Payer: Self-pay | Admitting: Urology

## 2024-07-11 DIAGNOSIS — N401 Enlarged prostate with lower urinary tract symptoms: Secondary | ICD-10-CM

## 2025-07-09 ENCOUNTER — Ambulatory Visit: Admitting: Urology
